# Patient Record
Sex: Female | Born: 1975 | Race: Asian | Hispanic: No | Marital: Married | Smoking: Never smoker
Health system: Southern US, Community
[De-identification: ages and names within clinical notes are randomized; demographics above are authoritative.]

## PROBLEM LIST (undated history)

## (undated) DIAGNOSIS — Z973 Presence of spectacles and contact lenses: Secondary | ICD-10-CM

## (undated) HISTORY — PX: NOSE SURGERY: SHX723

---

## 2012-01-09 ENCOUNTER — Encounter (HOSPITAL_COMMUNITY): Payer: Self-pay | Admitting: *Deleted

## 2012-01-09 ENCOUNTER — Emergency Department (HOSPITAL_COMMUNITY)
Admission: EM | Admit: 2012-01-09 | Discharge: 2012-01-10 | Disposition: A | Discharge status: Home / Self Care | Payer: BC Managed Care – PPO | Attending: Emergency Medicine | Admitting: Emergency Medicine

## 2012-01-09 ENCOUNTER — Emergency Department (HOSPITAL_COMMUNITY): Payer: BC Managed Care – PPO

## 2012-01-09 DIAGNOSIS — IMO0002 Reserved for concepts with insufficient information to code with codable children: Secondary | ICD-10-CM | POA: Insufficient documentation

## 2012-01-09 DIAGNOSIS — S022XXB Fracture of nasal bones, initial encounter for open fracture: Secondary | ICD-10-CM | POA: Insufficient documentation

## 2012-01-09 DIAGNOSIS — Z23 Encounter for immunization: Secondary | ICD-10-CM | POA: Insufficient documentation

## 2012-01-09 MED ORDER — CLINDAMYCIN HCL 150 MG PO CAPS: 300.0000 mg | ORAL_CAPSULE | Freq: Three times a day (TID) | ORAL | Status: DC

## 2012-01-09 MED ORDER — TETANUS-DIPHTH-ACELL PERTUSSIS 5-2.5-18.5 LF-MCG/0.5 IM SUSP
0.5000 mL | Freq: Once | INTRAMUSCULAR | Status: AC
  Administered 2012-01-09: 0.5 mL via INTRAMUSCULAR
  Filled 2012-01-09: qty 0.5

## 2012-01-09 MED ORDER — HYDROCODONE-ACETAMINOPHEN 5-325 MG PO TABS: 1.0000 | ORAL_TABLET | ORAL | Status: DC | PRN

## 2012-01-09 MED ORDER — HYDROCODONE-ACETAMINOPHEN 5-325 MG PO TABS
1.0000 | ORAL_TABLET | Freq: Once | ORAL | Status: AC
  Administered 2012-01-09: 1 via ORAL
  Filled 2012-01-09: qty 1

## 2012-01-09 NOTE — ED Notes (Signed)
Patient transported to CT 
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
## 2012-01-09 NOTE — ED Notes (Signed)
Nose ring removed by husband using sterile pin cutters.
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
## 2012-01-09 NOTE — ED Notes (Signed)
Patient applying ice to area.

## 2012-01-22 ENCOUNTER — Ambulatory Visit (INDEPENDENT_AMBULATORY_CARE_PROVIDER_SITE_OTHER): Payer: BC Managed Care – PPO | Admitting: Internal Medicine

## 2012-01-22 VITALS — BP 92/60 | HR 91 | Temp 98.3°F | Resp 16 | Ht 60.0 in | Wt 96.6 lb

## 2012-01-22 DIAGNOSIS — J029 Acute pharyngitis, unspecified: Secondary | ICD-10-CM

## 2012-01-22 DIAGNOSIS — J020 Streptococcal pharyngitis: Secondary | ICD-10-CM

## 2012-01-22 MED ORDER — AMOXICILLIN 875 MG PO TABS: 875.0000 mg | ORAL_TABLET | Freq: Two times a day (BID) | ORAL | Status: DC

## 2012-01-22 NOTE — Progress Notes (Signed)
  Subjective:    Patient ID: Laura Wells, female    DOB: 1975/12/27, 36 y.o.   MRN: [id number]  HPI Felt sluggish yesterday then awoke with a sore throat today No definite fever No cough or rhinorrhea   Review of Systems     Objective:   Physical Exam Vital signs stable TMs clear Nares clear Throat with 3+ tonsils covered with white exudate Shotty a.c. Nodes    Results for orders placed in visit on 01/22/12  POCT RAPID STREP A (OFFICE)      Component Value Range   Rapid Strep A Screen Positive (*) Negative          Assessment & Plan:  Problem #1 strep pharyngitis Plan Amoxicillin 875 twice a day for 10 days

## 2012-08-18 ENCOUNTER — Ambulatory Visit: Payer: BC Managed Care – PPO

## 2014-12-03 ENCOUNTER — Other Ambulatory Visit: Payer: Self-pay | Admitting: Internal Medicine

## 2014-12-03 DIAGNOSIS — E049 Nontoxic goiter, unspecified: Secondary | ICD-10-CM

## 2014-12-05 ENCOUNTER — Other Ambulatory Visit: Payer: Self-pay | Admitting: Internal Medicine

## 2014-12-05 ENCOUNTER — Ambulatory Visit
Admission: RE | Admit: 2014-12-05 | Discharge: 2014-12-05 | Disposition: A | Discharge status: Home / Self Care | Payer: BLUE CROSS/BLUE SHIELD | Source: Ambulatory Visit | Attending: Internal Medicine | Admitting: Internal Medicine

## 2014-12-05 DIAGNOSIS — E049 Nontoxic goiter, unspecified: Secondary | ICD-10-CM

## 2015-06-25 DIAGNOSIS — J209 Acute bronchitis, unspecified: Secondary | ICD-10-CM | POA: Diagnosis not present

## 2015-06-25 DIAGNOSIS — R05 Cough: Secondary | ICD-10-CM | POA: Diagnosis not present

## 2015-06-25 DIAGNOSIS — R350 Frequency of micturition: Secondary | ICD-10-CM | POA: Diagnosis not present

## 2015-12-19 DIAGNOSIS — Z6821 Body mass index (BMI) 21.0-21.9, adult: Secondary | ICD-10-CM | POA: Diagnosis not present

## 2015-12-19 DIAGNOSIS — Z1231 Encounter for screening mammogram for malignant neoplasm of breast: Secondary | ICD-10-CM | POA: Diagnosis not present

## 2015-12-19 DIAGNOSIS — Z01419 Encounter for gynecological examination (general) (routine) without abnormal findings: Secondary | ICD-10-CM | POA: Diagnosis not present

## 2016-01-02 DIAGNOSIS — N760 Acute vaginitis: Secondary | ICD-10-CM | POA: Diagnosis not present

## 2016-01-06 DIAGNOSIS — Z0001 Encounter for general adult medical examination with abnormal findings: Secondary | ICD-10-CM | POA: Diagnosis not present

## 2016-01-06 DIAGNOSIS — E785 Hyperlipidemia, unspecified: Secondary | ICD-10-CM | POA: Diagnosis not present

## 2016-01-06 DIAGNOSIS — E559 Vitamin D deficiency, unspecified: Secondary | ICD-10-CM | POA: Diagnosis not present

## 2016-01-06 DIAGNOSIS — Z23 Encounter for immunization: Secondary | ICD-10-CM | POA: Diagnosis not present

## 2016-01-07 DIAGNOSIS — M62838 Other muscle spasm: Secondary | ICD-10-CM | POA: Diagnosis not present

## 2016-01-15 DIAGNOSIS — R87610 Atypical squamous cells of undetermined significance on cytologic smear of cervix (ASC-US): Secondary | ICD-10-CM | POA: Diagnosis not present

## 2016-01-15 DIAGNOSIS — N926 Irregular menstruation, unspecified: Secondary | ICD-10-CM | POA: Diagnosis not present

## 2016-01-22 DIAGNOSIS — N921 Excessive and frequent menstruation with irregular cycle: Secondary | ICD-10-CM | POA: Diagnosis not present

## 2016-02-26 DIAGNOSIS — J209 Acute bronchitis, unspecified: Secondary | ICD-10-CM | POA: Diagnosis not present

## 2016-03-08 ENCOUNTER — Other Ambulatory Visit: Payer: Self-pay | Admitting: Internal Medicine

## 2016-03-08 ENCOUNTER — Ambulatory Visit
Admission: RE | Admit: 2016-03-08 | Discharge: 2016-03-08 | Disposition: A | Discharge status: Home / Self Care | Payer: BLUE CROSS/BLUE SHIELD | Source: Ambulatory Visit | Attending: Internal Medicine | Admitting: Internal Medicine

## 2016-03-08 DIAGNOSIS — R059 Cough, unspecified: Secondary | ICD-10-CM

## 2016-03-08 DIAGNOSIS — R509 Fever, unspecified: Secondary | ICD-10-CM | POA: Insufficient documentation

## 2016-03-08 DIAGNOSIS — R05 Cough: Secondary | ICD-10-CM

## 2016-03-08 DIAGNOSIS — J45991 Cough variant asthma: Secondary | ICD-10-CM | POA: Diagnosis not present

## 2016-03-08 DIAGNOSIS — R062 Wheezing: Secondary | ICD-10-CM | POA: Diagnosis not present

## 2016-03-09 DIAGNOSIS — J209 Acute bronchitis, unspecified: Secondary | ICD-10-CM | POA: Diagnosis not present

## 2016-12-27 IMAGING — CR DG CHEST 2V
1 series · 2 of 2 positions shown · non-contrast
Comparison: None in PACs

CLINICAL DATA: Cough for the past 3 weeks with left-sided chest
discomfort for the past 2 weeks. No cardiac history, nonsmoker.

EXAM:
CHEST  2 VIEW

[Series 1: dg chest 2 view · 0.14mm/px · 2 of 2 slices shown]
[im 1/2]
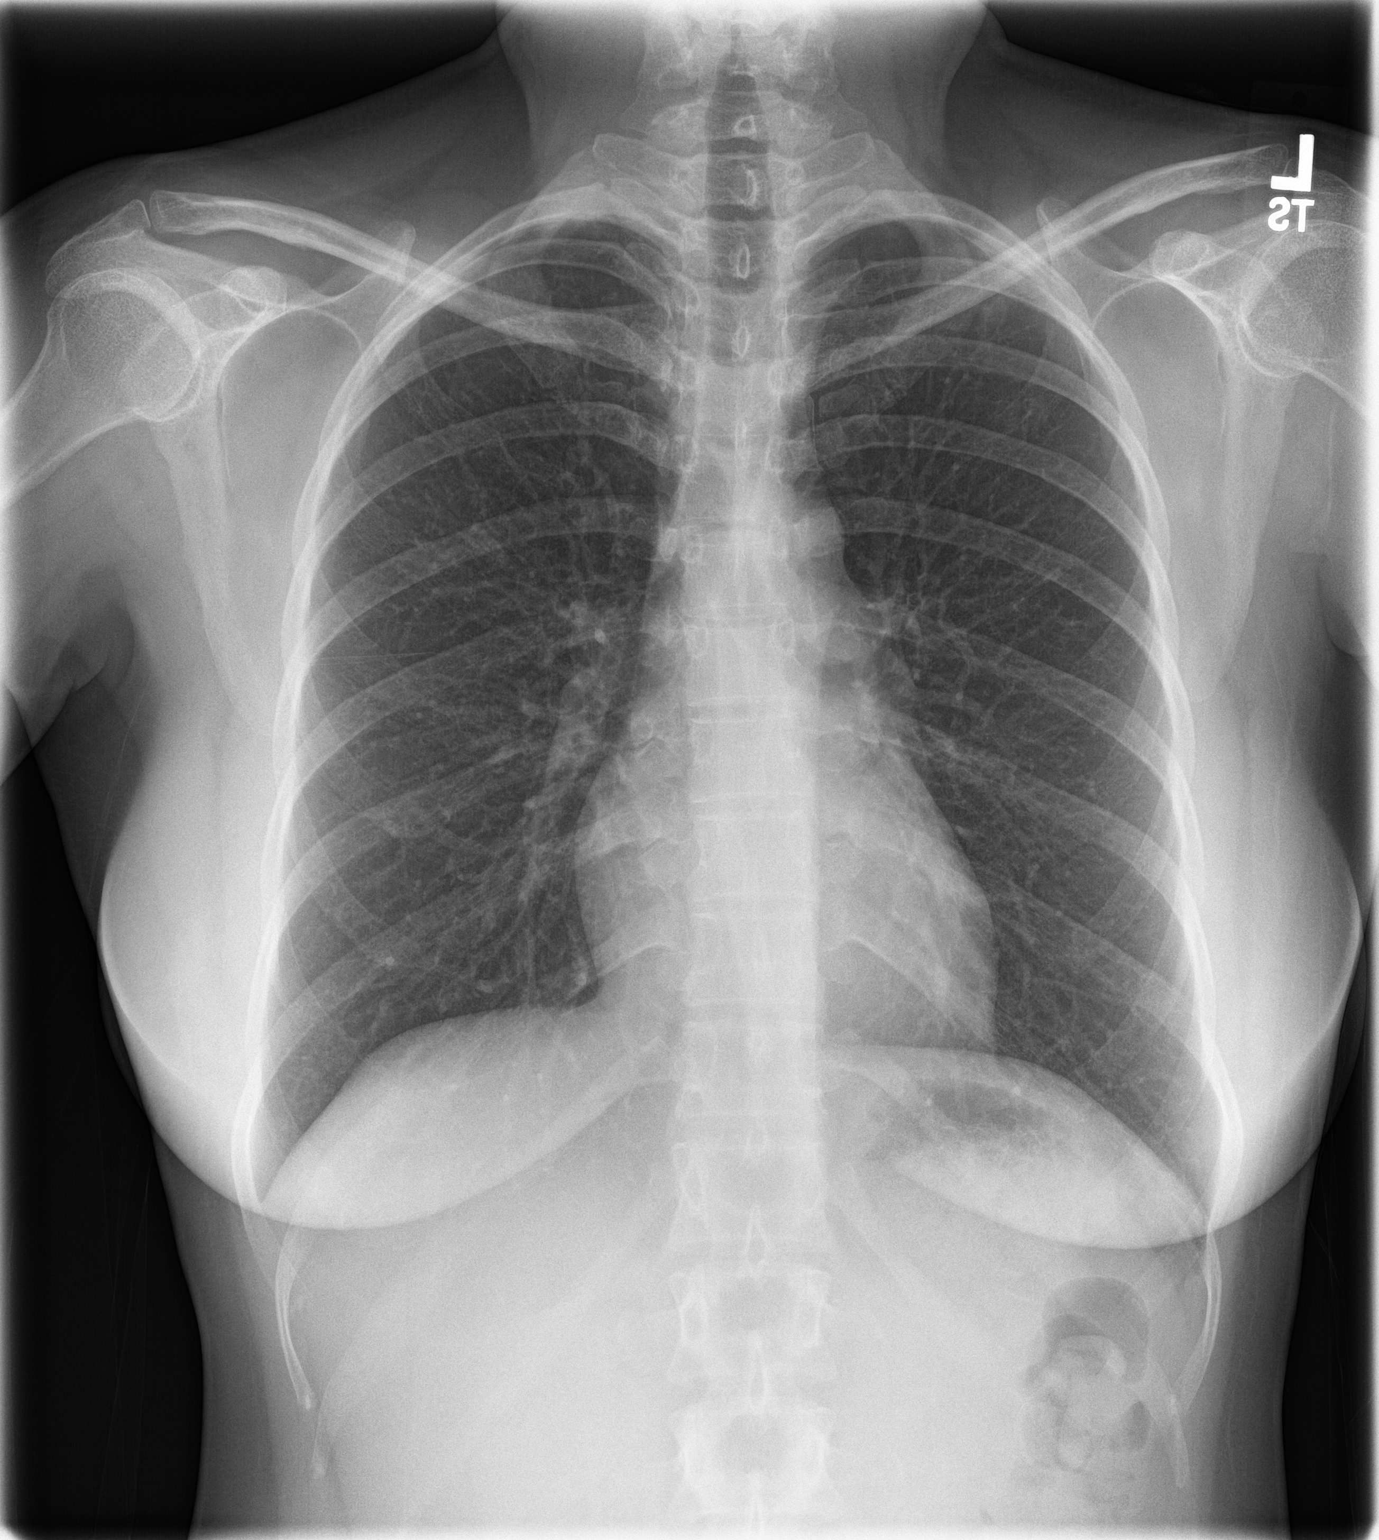
[im 2/2]
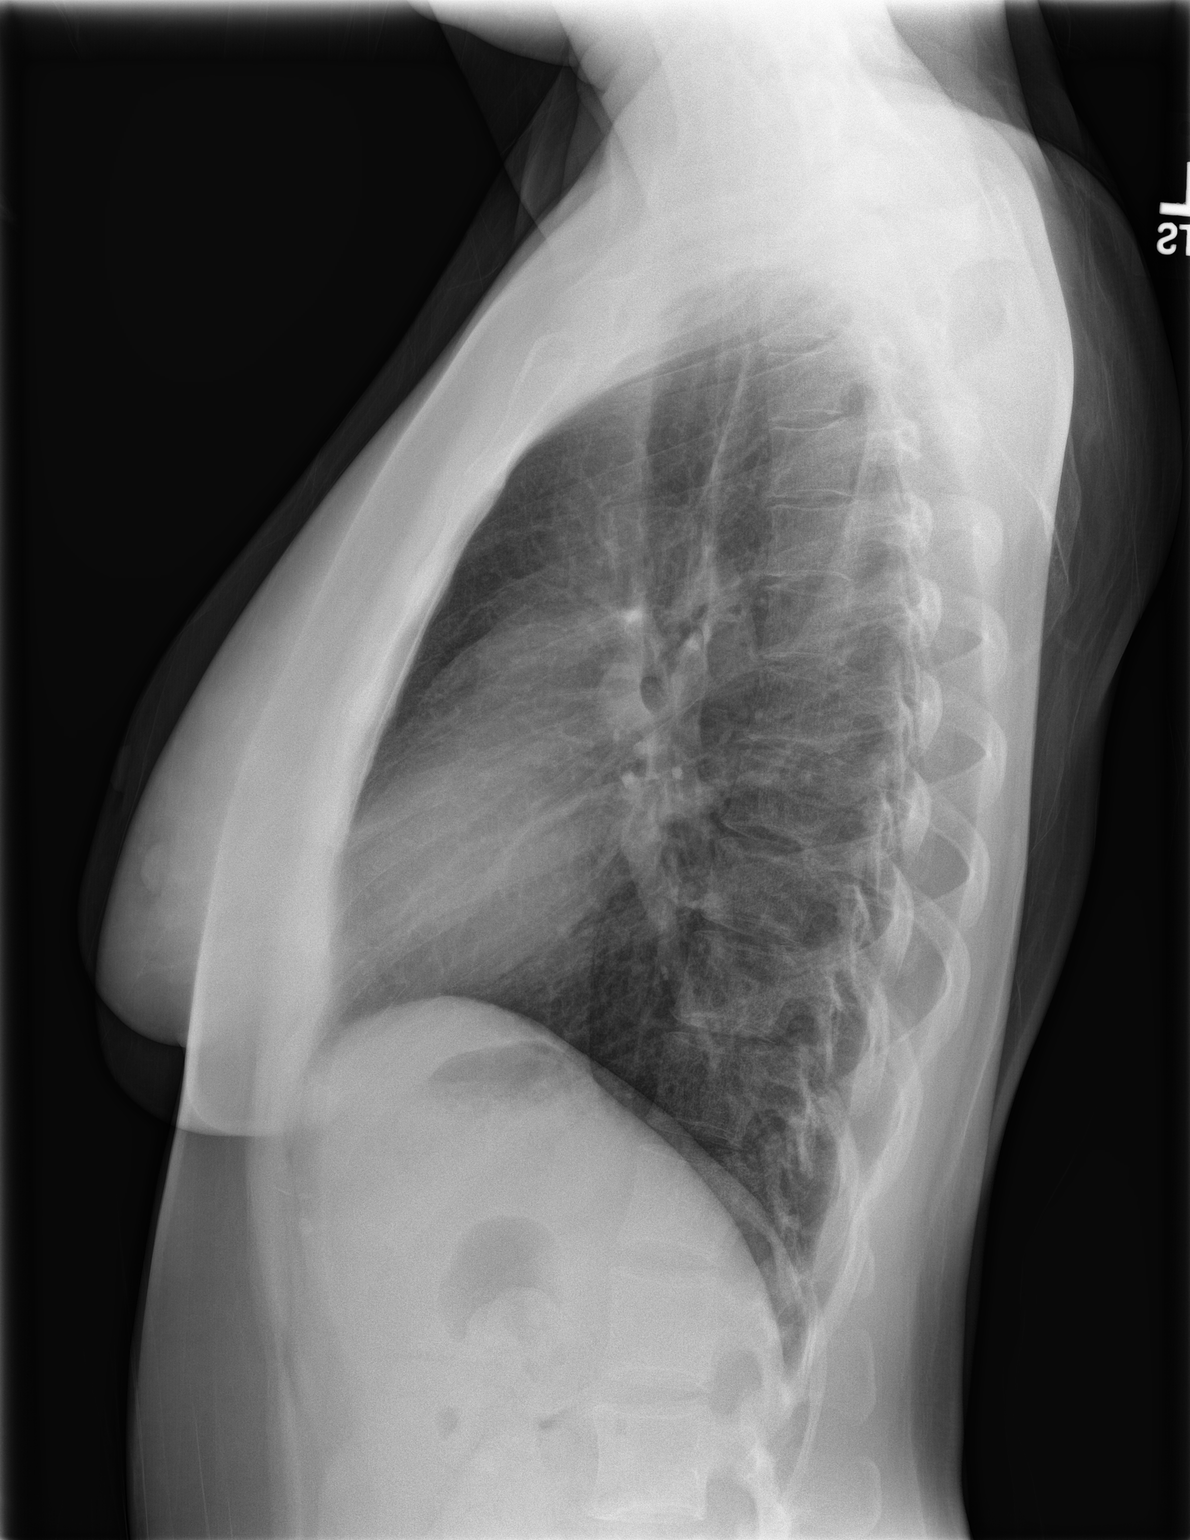

[2 of 2 positions shown; findings below may reference images not displayed]

FINDINGS: The lungs are borderline hyperinflated but clear. The heart and
pulmonary vascularity are normal. The mediastinum is normal in
width. There is no pleural effusion. The bony thorax exhibits no
acute abnormality.
IMPRESSION: There is no active cardiopulmonary disease.

## 2017-03-09 DIAGNOSIS — Z0000 Encounter for general adult medical examination without abnormal findings: Secondary | ICD-10-CM | POA: Diagnosis not present

## 2017-03-09 DIAGNOSIS — Z23 Encounter for immunization: Secondary | ICD-10-CM | POA: Diagnosis not present

## 2017-03-10 DIAGNOSIS — E049 Nontoxic goiter, unspecified: Secondary | ICD-10-CM | POA: Diagnosis not present

## 2017-03-10 DIAGNOSIS — Z13220 Encounter for screening for lipoid disorders: Secondary | ICD-10-CM | POA: Diagnosis not present

## 2017-03-10 DIAGNOSIS — R5383 Other fatigue: Secondary | ICD-10-CM | POA: Diagnosis not present

## 2017-03-10 DIAGNOSIS — E559 Vitamin D deficiency, unspecified: Secondary | ICD-10-CM | POA: Diagnosis not present

## 2017-03-31 DIAGNOSIS — J0100 Acute maxillary sinusitis, unspecified: Secondary | ICD-10-CM | POA: Diagnosis not present

## 2017-04-19 DIAGNOSIS — R0602 Shortness of breath: Secondary | ICD-10-CM | POA: Diagnosis not present

## 2017-04-19 DIAGNOSIS — R05 Cough: Secondary | ICD-10-CM | POA: Diagnosis not present

## 2017-04-19 DIAGNOSIS — J209 Acute bronchitis, unspecified: Secondary | ICD-10-CM | POA: Diagnosis not present

## 2017-04-21 ENCOUNTER — Encounter: Payer: Self-pay | Admitting: Obstetrics & Gynecology

## 2017-04-21 ENCOUNTER — Ambulatory Visit: Payer: BLUE CROSS/BLUE SHIELD | Admitting: Obstetrics & Gynecology

## 2017-04-21 VITALS — BP 102/70

## 2017-04-21 DIAGNOSIS — N945 Secondary dysmenorrhea: Secondary | ICD-10-CM | POA: Diagnosis not present

## 2017-04-21 DIAGNOSIS — R198 Other specified symptoms and signs involving the digestive system and abdomen: Secondary | ICD-10-CM | POA: Diagnosis not present

## 2017-04-21 NOTE — Patient Instructions (Signed)
1. Secondary dysmenorrhea Normal gynecologic exam.  Secondary dysmenorrhea.  Will try regular ibuprofen 600 mg every 8 hours during her periods.  Will write a calendar of her pain and the quality of the pain.  Will follow up for annual gynecologic exam in March and decide if further management steps are needed.  2. Pain with bowel movements No evidence of rectal or anal pathology.  Pain more likely due to uterine contractions during her menses.  Will try ibuprofen as above and follow-up in March.  Laura Wells, good seeing you today!  Dysmenorrhea Menstrual cramps (dysmenorrhea) are caused by the muscles of the uterus tightening (contracting) during a menstrual period. For some women, this discomfort is merely bothersome. For others, dysmenorrhea can be severe enough to interfere with everyday activities for a few days each month. Primary dysmenorrhea is menstrual cramps that last a couple of days when you start having menstrual periods or soon after. This often begins after a teenager starts having her period. As a woman gets older or has a baby, the cramps will usually lessen or disappear. Secondary dysmenorrhea begins later in life, lasts longer, and the pain may be stronger than primary dysmenorrhea. The pain may start before the period and last a few days after the period. What are the causes? Dysmenorrhea is usually caused by an underlying problem, such as:  The tissue lining the uterus grows outside of the uterus in other areas of the body (endometriosis).  The endometrial tissue, which normally lines the uterus, is found in or grows into the muscular walls of the uterus (adenomyosis).  The pelvic blood vessels are engorged with blood just before the menstrual period (pelvic congestive syndrome).  Overgrowth of cells (polyps) in the lining of the uterus or cervix.  Falling down of the uterus (prolapse) because of loose or stretched ligaments.  Depression.  Bladder problems, infection, or  inflammation.  Problems with the intestine, a tumor, or irritable bowel syndrome.  Cancer of the female organs or bladder.  A severely tipped uterus.  A very tight opening or closed cervix.  Noncancerous tumors of the uterus (fibroids).  Pelvic inflammatory disease (PID).  Pelvic scarring (adhesions) from a previous surgery.  Ovarian cyst.  An intrauterine device (IUD) used for birth control.  What increases the risk? You may be at greater risk of dysmenorrhea if:  You are younger than age 22.  You started puberty early.  You have irregular or heavy bleeding.  You have never given birth.  You have a family history of this problem.  You are a smoker.  What are the signs or symptoms?  Cramping or throbbing pain in your lower abdomen.  Headaches.  Lower back pain.  Nausea or vomiting.  Diarrhea.  Sweating or dizziness.  Loose stools. How is this diagnosed? A diagnosis is based on your history, symptoms, physical exam, diagnostic tests, or procedures. Diagnostic tests or procedures may include:  Blood tests.  Ultrasonography.  An examination of the lining of the uterus (dilation and curettage, D&C).  An examination inside your abdomen or pelvis with a scope (laparoscopy).  X-rays.  CT scan.  MRI.  An examination inside the bladder with a scope (cystoscopy).  An examination inside the intestine or stomach with a scope (colonoscopy, gastroscopy).  How is this treated? Treatment depends on the cause of the dysmenorrhea. Treatment may include:  Pain medicine prescribed by your health care provider.  Birth control pills or an IUD with progesterone hormone in it.  Hormone replacement therapy.  Nonsteroidal anti-inflammatory drugs (NSAIDs). These may help stop the production of prostaglandins.  Surgery to remove adhesions, endometriosis, ovarian cyst, or fibroids.  Removal of the uterus (hysterectomy).  Progesterone shots to stop the  menstrual period.  Cutting the nerves on the sacrum that go to the female organs (presacral neurectomy).  Electric current to the sacral nerves (sacral nerve stimulation).  Antidepressant medicine.  Psychiatric therapy, counseling, or group therapy.  Exercise and physical therapy.  Meditation and yoga therapy.  Acupuncture.  Follow these instructions at home:  Only take over-the-counter or prescription medicines as directed by your health care provider.  Place a heating pad or hot water bottle on your lower back or abdomen. Do not sleep with the heating pad.  Use aerobic exercises, walking, swimming, biking, and other exercises to help lessen the cramping.  Massage to the lower back or abdomen may help.  Stop smoking.  Avoid alcohol and caffeine. Contact a health care provider if:  Your pain does not get better with medicine.  You have pain with sexual intercourse.  Your pain increases and is not controlled with medicines.  You have abnormal vaginal bleeding with your period.  You develop nausea or vomiting with your period that is not controlled with medicine. Get help right away if: You pass out. This information is not intended to replace advice given to you by your health care provider. Make sure you discuss any questions you have with your health care provider. Document Released: 03/08/2005 Document Revised: 08/14/2015 Document Reviewed: 08/24/2012 Elsevier Interactive Patient Education  2017 [employer]

## 2017-04-21 NOTE — Progress Notes (Signed)
    Laura Wells February 18, 1976 [id number]        42 y.o.  G3P3L3 Married.  Husband Vasectomized.  RP: Menstrual pain worse around BMs x 1 year  HPI: Menstrual periods every months lasting 4 days with light flow.  Patient complains of pelvic cramping and pain around the anal area happening only during her periods and at the time when she sits down for a bowel movement.  She reports normal bowel movements with normal texture both during her periods and when she is not bleeding.  No blood around the stools and no rectal bleeding.  No pain with intercourse.  Patient has both vaginal and anal sex.  Normal vaginal secretions.  Uses ibuprofen as needed but not on a regular basis during her periods.  Micturitions normal.  No fever.   OB History  Gravida Para Term Preterm AB Living  3 3       3   SAB TAB Ectopic Multiple Live Births               # Outcome Date GA Lbr Len/2nd Weight Sex Delivery Anes PTL Lv  3 Para           2 Para           1 Para               Past medical history,surgical history, problem list, medications, allergies, family history and social history were all reviewed and documented in the EPIC chart.   Directed ROS with pertinent positives and negatives documented in the history of present illness/assessment and plan.  Exam:  Vitals:   04/21/17 1146  BP: 102/70   General appearance:  Normal  Abdomen: Soft, not distended, nontender  Gynecologic exam: Vulva normal.  Bimanual exam:  Uterus AV, normal volume, mobile, nontender.  No adnexal mass, nontender.  Rectal exam: Normal, no lesion felt, nontender, no blood on glove.   Assessment/Plan:  42 y.o. G3P3   1. Secondary dysmenorrhea Normal gynecologic exam.  Secondary dysmenorrhea.  Will try regular ibuprofen 600 mg every 8 hours during her periods.  Will write a calendar of her pain and the quality of the pain.  Will follow up for annual gynecologic exam in March and decide if further management steps are  needed.  2. Pain with bowel movements No evidence of rectal or anal pathology.  Pain more likely due to uterine contractions during her menses.  Will try ibuprofen as above and follow-up in March.  Counseling on above issues >50% x 25 minutes.  Genia DelMarie-Lyne Darion Juhasz MD, 12:05 PM 04/21/2017

## 2017-05-20 ENCOUNTER — Ambulatory Visit: Payer: BLUE CROSS/BLUE SHIELD | Admitting: Obstetrics & Gynecology

## 2017-05-20 ENCOUNTER — Encounter: Payer: Self-pay | Admitting: Obstetrics & Gynecology

## 2017-05-20 VITALS — BP 106/70 | Ht 60.0 in | Wt 114.0 lb

## 2017-05-20 DIAGNOSIS — Z01419 Encounter for gynecological examination (general) (routine) without abnormal findings: Secondary | ICD-10-CM | POA: Diagnosis not present

## 2017-05-20 DIAGNOSIS — N945 Secondary dysmenorrhea: Secondary | ICD-10-CM | POA: Diagnosis not present

## 2017-05-20 DIAGNOSIS — Z9189 Other specified personal risk factors, not elsewhere classified: Secondary | ICD-10-CM | POA: Diagnosis not present

## 2017-05-20 DIAGNOSIS — R87610 Atypical squamous cells of undetermined significance on cytologic smear of cervix (ASC-US): Secondary | ICD-10-CM | POA: Diagnosis not present

## 2017-05-20 NOTE — Progress Notes (Signed)
Laura Wells 1975/12/22 [id number]   History:    42 y.o. G3P3L3  Married.  Vasectomy  RP:  Established patient presenting for annual gyn exam   HPI: Menses normal with light flow x 4 days every 3 weeks.  Presented with Dysmenorrhea 04/21/2017, but LMP with no pelvic cramp or pain on 04/29/2017.  No pain with IC.  Urine/BMs wnl.  Breasts wnl.  BMI 22.26.  Health labs with Fam MD.  Past medical history,surgical history, family history and social history were all reviewed and documented in the EPIC chart.  Gynecologic History Patient's last menstrual period was 04/29/2017. Contraception: vasectomy Last Pap: 12/2015. Results were: Negative/HPV HR neg Last mammogram: 11/2015. Results were: normal Bone Density: Never Colonoscopy: Never  Obstetric History OB History  Gravida Para Term Preterm AB Living  3 3       3   SAB TAB Ectopic Multiple Live Births               # Outcome Date GA Lbr Len/2nd Weight Sex Delivery Anes PTL Lv  3 Para           2 Para           1 Para                ROS: A ROS was performed and pertinent positives and negatives are included in the history.  GENERAL: No fevers or chills. HEENT: No change in vision, no earache, sore throat or sinus congestion. NECK: No pain or stiffness. CARDIOVASCULAR: No chest pain or pressure. No palpitations. PULMONARY: No shortness of breath, cough or wheeze. GASTROINTESTINAL: No abdominal pain, nausea, vomiting or diarrhea, melena or bright red blood per rectum. GENITOURINARY: No urinary frequency, urgency, hesitancy or dysuria. MUSCULOSKELETAL: No joint or muscle pain, no back pain, no recent trauma. DERMATOLOGIC: No rash, no itching, no lesions. ENDOCRINE: No polyuria, polydipsia, no heat or cold intolerance. No recent change in weight. HEMATOLOGICAL: No anemia or easy bruising or bleeding. NEUROLOGIC: No headache, seizures, numbness, tingling or weakness. PSYCHIATRIC: No depression, no loss of interest in normal activity or  change in sleep pattern.     Exam:   BP 106/70   Ht 5' (1.524 m)   Wt 114 lb (51.7 kg)   LMP 04/29/2017 Comment: vasectomy  BMI 22.26 kg/m   Body mass index is 22.26 kg/m.  General appearance : Well developed well nourished female. No acute distress HEENT: Eyes: no retinal hemorrhage or exudates,  Neck supple, trachea midline, no carotid bruits, no thyroidmegaly Lungs: Clear to auscultation, no rhonchi or wheezes, or rib retractions  Heart: Regular rate and rhythm, no murmurs or gallops Breast:Examined in sitting and supine position were symmetrical in appearance, no palpable masses or tenderness,  no skin retraction, no nipple inversion, no nipple discharge, no skin discoloration, no axillary or supraclavicular lymphadenopathy Abdomen: no palpable masses or tenderness, no rebound or guarding Extremities: no edema or skin discoloration or tenderness  Pelvic: Vulva: Normal             Vagina: No gross lesions or discharge  Cervix: No gross lesions or discharge.  Pap reflex done.  Uterus  AV, normal size, shape and consistency, non-tender and mobile  Adnexa  Without masses or tenderness  Anus: Normal   Assessment/Plan:  42 y.o. female for annual exam   1. Encounter for routine gynecological examination with Papanicolaou smear of cervix Normal gynecologic exam.  Pap reflex done today.  Breast exam normal.  Will schedule  screening mammogram at the breast center.  Health labs with family physician.  Recommend regular physical activity.  2. Relies on partner vasectomy for contraception  3. Secondary dysmenorrhea Resolved with LMP 04/29/2017.  [name], 12:23 PM 05/20/2017

## 2017-05-20 NOTE — Addendum Note (Signed)
Addended by: Berna SpareASTILLO, Gery Sabedra A on: 05/20/2017 01:07 PM   Modules accepted: Orders

## 2017-05-20 NOTE — Patient Instructions (Signed)
1. Encounter for routine gynecological examination with Papanicolaou smear of cervix Normal gynecologic exam.  Pap reflex done today.  Breast exam normal.  Will schedule screening mammogram at the breast center.  Health labs with family physician.  Recommend regular physical activity.  2. Relies on partner vasectomy for contraception  3. Secondary dysmenorrhea Resolved with LMP 04/29/2017.  [place], it was a pleasure seeing you today!  I will inform you of your results as soon as they are available.  Health Maintenance, Female Adopting a healthy lifestyle and getting preventive care can go a long way to promote health and wellness. Talk with your health care provider about what schedule of regular examinations is right for you. This is a good chance for you to check in with your provider about disease prevention and staying healthy. In between checkups, there are plenty of things you can do on your own. Experts have done a lot of research about which lifestyle changes and preventive measures are most likely to keep you healthy. Ask your health care provider for more information. Weight and diet Eat a healthy diet  Be sure to include plenty of vegetables, fruits, low-fat dairy products, and lean protein.  Do not eat a lot of foods high in solid fats, added sugars, or salt.  Get regular exercise. This is one of the most important things you can do for your health. ? Most adults should exercise for at least 150 minutes each week. The exercise should increase your heart rate and make you sweat (moderate-intensity exercise). ? Most adults should also do strengthening exercises at least twice a week. This is in addition to the moderate-intensity exercise.  Maintain a healthy weight  Body mass index (BMI) is a measurement that can be used to identify possible weight problems. It estimates body fat based on height and weight. Your health care provider can help determine your BMI and help you achieve or  maintain a healthy weight.  For females 58 years of age and older: ? A BMI below 18.5 is considered underweight. ? A BMI of 18.5 to 24.9 is normal. ? A BMI of 25 to 29.9 is considered overweight. ? A BMI of 30 and above is considered obese.  Watch levels of cholesterol and blood lipids  You should start having your blood tested for lipids and cholesterol at 44 years of age, then have this test every 5 years.  You may need to have your cholesterol levels checked more often if: ? Your lipid or cholesterol levels are high. ? You are older than 44 years of age. ? You are at high risk for heart disease.  Cancer screening Lung Cancer  Lung cancer screening is recommended for adults 81-12 years old who are at high risk for lung cancer because of a history of smoking.  A yearly low-dose CT scan of the lungs is recommended for people who: ? Currently smoke. ? Have quit within the past 15 years. ? Have at least a 30-pack-year history of smoking. A pack year is smoking an average of one pack of cigarettes a day for 1 year.  Yearly screening should continue until it has been 15 years since you quit.  Yearly screening should stop if you develop a health problem that would prevent you from having lung cancer treatment.  Breast Cancer  Practice breast self-awareness. This means understanding how your breasts normally appear and feel.  It also means doing regular breast self-exams. Let your health care provider know about any changes,  no matter how small.  If you are in your 20s or 30s, you should have a clinical breast exam (CBE) by a health care provider every 1-3 years as part of a regular health exam.  If you are 40 or older, have a CBE every year. Also consider having a breast X-ray (mammogram) every year.  If you have a family history of breast cancer, talk to your health care provider about genetic screening.  If you are at high risk for breast cancer, talk to your health care  provider about having an MRI and a mammogram every year.  Breast cancer gene (BRCA) assessment is recommended for women who have family members with BRCA-related cancers. BRCA-related cancers include: ? Breast. ? Ovarian. ? Tubal. ? Peritoneal cancers.  Results of the assessment will determine the need for genetic counseling and BRCA1 and BRCA2 testing.  Cervical Cancer Your health care provider may recommend that you be screened regularly for cancer of the pelvic organs (ovaries, uterus, and vagina). This screening involves a pelvic examination, including checking for microscopic changes to the surface of your cervix (Pap test). You may be encouraged to have this screening done every 3 years, beginning at age 21.  For women ages 30-65, health care providers may recommend pelvic exams and Pap testing every 3 years, or they may recommend the Pap and pelvic exam, combined with testing for human papilloma virus (HPV), every 5 years. Some types of HPV increase your risk of cervical cancer. Testing for HPV may also be done on women of any age with unclear Pap test results.  Other health care providers may not recommend any screening for nonpregnant women who are considered low risk for pelvic cancer and who do not have symptoms. Ask your health care provider if a screening pelvic exam is right for you.  If you have had past treatment for cervical cancer or a condition that could lead to cancer, you need Pap tests and screening for cancer for at least 20 years after your treatment. If Pap tests have been discontinued, your risk factors (such as having a new sexual partner) need to be reassessed to determine if screening should resume. Some women have medical problems that increase the chance of getting cervical cancer. In these cases, your health care provider may recommend more frequent screening and Pap tests.  Colorectal Cancer  This type of cancer can be detected and often prevented.  Routine  colorectal cancer screening usually begins at 44 years of age and continues through 44 years of age.  Your health care provider may recommend screening at an earlier age if you have risk factors for colon cancer.  Your health care provider may also recommend using home test kits to check for hidden blood in the stool.  A small camera at the end of a tube can be used to examine your colon directly (sigmoidoscopy or colonoscopy). This is done to check for the earliest forms of colorectal cancer.  Routine screening usually begins at age 50.  Direct examination of the colon should be repeated every 5-10 years through 44 years of age. However, you may need to be screened more often if early forms of precancerous polyps or small growths are found.  Skin Cancer  Check your skin from head to toe regularly.  Tell your health care provider about any new moles or changes in moles, especially if there is a change in a mole's shape or color.  Also tell your health care provider if you   have a mole that is larger than the size of a pencil eraser.  Always use sunscreen. Apply sunscreen liberally and repeatedly throughout the day.  Protect yourself by wearing long sleeves, pants, a wide-brimmed hat, and sunglasses whenever you are outside.  Heart disease, diabetes, and high blood pressure  High blood pressure causes heart disease and increases the risk of stroke. High blood pressure is more likely to develop in: ? People who have blood pressure in the high end of the normal range (130-139/85-89 mm Hg). ? People who are overweight or obese. ? People who are African American.  If you are 24-25 years of age, have your blood pressure checked every 3-5 years. If you are 2 years of age or older, have your blood pressure checked every year. You should have your blood pressure measured twice-once when you are at a hospital or clinic, and once when you are not at a hospital or clinic. Record the average of the  two measurements. To check your blood pressure when you are not at a hospital or clinic, you can use: ? An automated blood pressure machine at a pharmacy. ? A home blood pressure monitor.  If you are between 42 years and 59 years old, ask your health care provider if you should take aspirin to prevent strokes.  Have regular diabetes screenings. This involves taking a blood sample to check your fasting blood sugar level. ? If you are at a normal weight and have a low risk for diabetes, have this test once every three years after 44 years of age. ? If you are overweight and have a high risk for diabetes, consider being tested at a younger age or more often. Preventing infection Hepatitis B  If you have a higher risk for hepatitis B, you should be screened for this virus. You are considered at high risk for hepatitis B if: ? You were born in a country where hepatitis B is common. Ask your health care provider which countries are considered high risk. ? Your parents were born in a high-risk country, and you have not been immunized against hepatitis B (hepatitis B vaccine). ? You have HIV or AIDS. ? You use needles to inject street drugs. ? You live with someone who has hepatitis B. ? You have had sex with someone who has hepatitis B. ? You get hemodialysis treatment. ? You take certain medicines for conditions, including cancer, organ transplantation, and autoimmune conditions.  Hepatitis C  Blood testing is recommended for: ? Everyone born from [birth year] through 1967. ? Anyone with known risk factors for hepatitis C.  Sexually transmitted infections (STIs)  You should be screened for sexually transmitted infections (STIs) including gonorrhea and chlamydia if: ? You are sexually active and are younger than 44 years of age. ? You are older than 44 years of age and your health care provider tells you that you are at risk for this type of infection. ? Your sexual activity has changed since you  were last screened and you are at an increased risk for chlamydia or gonorrhea. Ask your health care provider if you are at risk.  If you do not have HIV, but are at risk, it may be recommended that you take a prescription medicine daily to prevent HIV infection. This is called pre-exposure prophylaxis (PrEP). You are considered at risk if: ? You are sexually active and do not regularly use condoms or know the HIV status of your partner(s). ? You take drugs by injection. ?  You are sexually active with a partner who has HIV.  Talk with your health care provider about whether you are at high risk of being infected with HIV. If you choose to begin PrEP, you should first be tested for HIV. You should then be tested every 3 months for as long as you are taking PrEP. Pregnancy  If you are premenopausal and you may become pregnant, ask your health care provider about preconception counseling.  If you may become pregnant, take 400 to 800 micrograms (mcg) of folic acid every day.  If you want to prevent pregnancy, talk to your health care provider about birth control (contraception). Osteoporosis and menopause  Osteoporosis is a disease in which the bones lose minerals and strength with aging. This can result in serious bone fractures. Your risk for osteoporosis can be identified using a bone density scan.  If you are 65 years of age or older, or if you are at risk for osteoporosis and fractures, ask your health care provider if you should be screened.  Ask your health care provider whether you should take a calcium or vitamin D supplement to lower your risk for osteoporosis.  Menopause may have certain physical symptoms and risks.  Hormone replacement therapy may reduce some of these symptoms and risks. Talk to your health care provider about whether hormone replacement therapy is right for you. Follow these instructions at home:  Schedule regular health, dental, and eye exams.  Stay current  with your immunizations.  Do not use any tobacco products including cigarettes, chewing tobacco, or electronic cigarettes.  If you are pregnant, do not drink alcohol.  If you are breastfeeding, limit how much and how often you drink alcohol.  Limit alcohol intake to no more than 1 drink per day for nonpregnant women. One drink equals 12 ounces of beer, 5 ounces of wine, or 1 ounces of hard liquor.  Do not use street drugs.  Do not share needles.  Ask your health care provider for help if you need support or information about quitting drugs.  Tell your health care provider if you often feel depressed.  Tell your health care provider if you have ever been abused or do not feel safe at home. This information is not intended to replace advice given to you by your health care provider. Make sure you discuss any questions you have with your health care provider. Document Released: 09/21/2010 Document Revised: 08/14/2015 Document Reviewed: 12/10/2014 Elsevier Interactive Patient Education  2018 [employer]  

## 2017-05-25 LAB — PAP IG W/ RFLX HPV ASCU

## 2017-05-25 LAB — HUMAN PAPILLOMAVIRUS, HIGH RISK: HPV DNA High Risk: DETECTED — AB

## 2017-05-31 ENCOUNTER — Telehealth: Payer: Self-pay

## 2017-05-31 NOTE — Telephone Encounter (Signed)
Patient said since office visit she had terrible itching vaginally. She had 2 Diflucan on hand but said the oral Rx does not usually help her. She took them both but still has just some slight itching as is on period. She asked if you would prescribed the Terazol 3 Vag Cream for her to use after her menses.  Ok?

## 2017-06-02 ENCOUNTER — Other Ambulatory Visit: Payer: Self-pay | Admitting: Obstetrics & Gynecology

## 2017-06-02 MED ORDER — TERCONAZOLE 0.8 % VA CREA: 1.0000 | TOPICAL_CREAM | Freq: Every day | VAGINAL | 0 refills | Status: DC

## 2017-06-02 NOTE — Telephone Encounter (Signed)
Rx sent. Patient informed. 

## 2017-06-02 NOTE — Telephone Encounter (Signed)
Yes, agree

## 2017-06-16 ENCOUNTER — Ambulatory Visit: Payer: BLUE CROSS/BLUE SHIELD | Admitting: Obstetrics & Gynecology

## 2017-06-16 ENCOUNTER — Encounter: Payer: Self-pay | Admitting: Obstetrics & Gynecology

## 2017-06-16 VITALS — BP 106/70

## 2017-06-16 DIAGNOSIS — R87810 Cervical high risk human papillomavirus (HPV) DNA test positive: Secondary | ICD-10-CM | POA: Diagnosis not present

## 2017-06-16 DIAGNOSIS — R87610 Atypical squamous cells of undetermined significance on cytologic smear of cervix (ASC-US): Secondary | ICD-10-CM | POA: Diagnosis not present

## 2017-06-16 DIAGNOSIS — Z113 Encounter for screening for infections with a predominantly sexual mode of transmission: Secondary | ICD-10-CM

## 2017-06-16 NOTE — Patient Instructions (Signed)
1. 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  Pending cervical biopsy as well as ECC.  Pending HPV 1618 and 45.  Pending gonorrhea and chlamydia.  Counseling on ASCUS and high risk HPV done.  Procedure reviewed with patient.  Will manage according to results.  Laura Wells, good seeing you today!   Colposcopy, Care After This sheet gives you information about how to care for yourself after your procedure. Your health care provider may also give you more specific instructions. If you have problems or questions, contact your health care provider. What can I expect after the procedure? If you had a colposcopy without a biopsy, you can expect to feel fine right away, but you may have some spotting for a few days. You can go back to your normal activities. If you had a colposcopy with a biopsy, it is common to have:  Soreness and pain. This may last for a few days.  Light-headedness.  Mild vaginal bleeding or dark-colored, grainy discharge. This may last for a few days. The discharge may be due to a solution that was used during the procedure. You may need to wear a sanitary pad during this time.  Spotting for at least 48 hours after the procedure.  Follow these instructions at home:  Take over-the-counter and prescription medicines only as told by your health care provider. Talk with your health care provider about what type of over-the-counter pain medicine and prescription medicine you can start taking again. It is especially important to talk with your health care provider if you take blood-thinning medicine.  Do not drive or use heavy machinery while taking prescription pain medicine.  For at least 3 days after your procedure, or as long as told by your health care provider, avoid: ? Douching. ? Using tampons. ? Having sexual intercourse.  Continue to use birth control (contraception).  Limit your physical activity for the first day after the  procedure as told by your health care provider. Ask your health care provider what activities are safe for you.  It is up to you to get the results of your procedure. Ask your health care provider, or the department performing the procedure, when your results will be ready.  Keep all follow-up visits as told by your health care provider. This is important. Contact a health care provider if:  You develop a skin rash. Get help right away if:  You are bleeding heavily from your vagina or you are passing blood clots. This includes using more than one sanitary pad per hour for 2 hours in a row.  You have a fever or chills.  You have pelvic pain.  You have abnormal, yellow-colored, or bad-smelling vaginal discharge. This could be a sign of infection.  You have severe pain or cramps in your lower abdomen that do not get better with medicine.  You feel light-headed or dizzy, or you faint. Summary  If you had a colposcopy without a biopsy, you can expect to feel fine right away, but you may have some spotting for a few days. You can go back to your normal activities.  If you had a colposcopy with a biopsy, you may notice mild pain and spotting for 48 hours after the procedure.  Avoid douching, using tampons, and having sexual intercourse for 3 days after the procedure or as long as told by your health care provider.  Contact your health care provider if you have bleeding, severe pain, or signs of infection. This  information is not intended to replace advice given to you by your health care provider. Make sure you discuss any questions you have with your health care provider. Document Released: 12/27/2012 Document Revised: 10/24/2015 Document Reviewed: 10/24/2015 Elsevier Interactive Patient Education  2018 [employer].

## 2017-06-16 NOTE — Addendum Note (Signed)
Addended by: Berna SpareASTILLO, Chattie Greeson A on: 06/16/2017 12:35 PM   Modules accepted: Orders

## 2017-06-16 NOTE — Progress Notes (Signed)
    Laura Wells [birth year] [id number]        42 y.o.  G3P3L3  Married.  Vasectomy.  RP: ASCUS/HPV HR pos for Colposcopy  HPI:  Previous h/o LGSIL and HR HPV positive.  But the last Pap was negative with HR HPV neg 12/2015.  Normal vaginal secretions after treating for yeast vaginitis.  No pelvic pain.   OB History  Gravida Para Term Preterm AB Living  3 3       3   SAB TAB Ectopic Multiple Live Births               # Outcome Date GA Lbr Len/2nd Weight Sex Delivery Anes PTL Lv  3 Para           2 Para           1 Para             Past medical history,surgical history, problem list, medications, allergies, family history and social history were all reviewed and documented in the EPIC chart.   Directed ROS with pertinent positives and negatives documented in the history of present illness/assessment and plan.  Exam:  Vitals:   06/16/17 1136  BP: 106/70   General appearance:  Normal  Colposcopy Procedure Note Chrissa Bays 06/16/2017  Indications:  ASCUS/HR HPV positive  Procedure Details  The risks and benefits of the procedure and Verbal informed consent obtained.  Speculum placed in vagina and excellent visualization of cervix achieved, cervix swabbed x 3 with acetic acid solution.  Findings:  Cervix colposcopy: Physical Exam  Genitourinary:      Vaginal colposcopy: Normal  Vulvar colposcopy: Grossly normal  Perirectal colposcopy: Grossly normal  The cervix was sprayed with Hurricane before performing the cervical biopsies.  Specimens: HPV 16-18-45.  Gono-Chlam.  Cervical Biopsy at 6 O'clock. ECC.  Complications:  None, hemostasis with Silver Nitrate. . Plan:  Management per results.    Assessment/Plan:  42 y.o. G3P3   1. 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  Pending cervical biopsy as well as ECC.  Pending HPV 1618 and 45.  Pending gonorrhea and chlamydia.  Counseling on ASCUS and high risk HPV  done.  Procedure reviewed with patient.  Will manage according to results.  Genia DelMarie-Lyne [place] MD, 11:55 AM 06/16/2017

## 2017-06-17 LAB — C. TRACHOMATIS/N. GONORRHOEAE RNA
C. trachomatis RNA, TMA: NOT DETECTED
N. GONORRHOEAE RNA, TMA: NOT DETECTED

## 2017-06-20 LAB — TISSUE PATH REPORT

## 2017-06-20 LAB — HPV TYPE 16 AND 18/45 RNA
HPV Type 16 RNA: NOT DETECTED
HPV Type 18/45 RNA: NOT DETECTED

## 2017-06-20 LAB — PATHOLOGY

## 2017-10-26 DIAGNOSIS — M79642 Pain in left hand: Secondary | ICD-10-CM | POA: Diagnosis not present

## 2018-01-02 ENCOUNTER — Telehealth: Payer: Self-pay | Admitting: *Deleted

## 2018-01-02 NOTE — Telephone Encounter (Signed)
Patient called left message in triage voicemail c/o BV symptoms, I called and left detailed message on cell to schedule OV.

## 2018-01-03 ENCOUNTER — Ambulatory Visit: Payer: BLUE CROSS/BLUE SHIELD | Admitting: Obstetrics & Gynecology

## 2018-01-03 ENCOUNTER — Encounter: Payer: Self-pay | Admitting: Obstetrics & Gynecology

## 2018-01-03 VITALS — BP 112/72

## 2018-01-03 DIAGNOSIS — N870 Mild cervical dysplasia: Secondary | ICD-10-CM

## 2018-01-03 DIAGNOSIS — N898 Other specified noninflammatory disorders of vagina: Secondary | ICD-10-CM

## 2018-01-03 DIAGNOSIS — R87610 Atypical squamous cells of undetermined significance on cytologic smear of cervix (ASC-US): Secondary | ICD-10-CM | POA: Diagnosis not present

## 2018-01-03 LAB — WET PREP FOR TRICH, YEAST, CLUE

## 2018-01-03 MED ORDER — METRONIDAZOLE 0.75 % VA GEL: 1.0000 | Freq: Every day | VAGINAL | 2 refills | Status: AC

## 2018-01-03 NOTE — Patient Instructions (Signed)
1. Vaginal odor Bacterial vaginosis clinically and confirmed by wet prep.  Decision to treat with MetroGel.  Usage reviewed and prescription sent to pharmacy. - WET PREP FOR TRICH, YEAST, CLUE  2. 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  High risk HPV positive, but HPV 16-18-45 negative.  6 month Pap test with high risk HPV done today.  Other orders - metroNIDAZOLE (METROGEL) 0.75 % vaginal gel; Place 1 Applicatorful vaginally daily for 5 days.  Laura Wells, good seeing you today!  I will inform you of your results as soon as they are available.

## 2018-01-03 NOTE — Addendum Note (Signed)
Addended by: Aura Camps on: 01/03/2018 12:55 PM   Modules accepted: Orders

## 2018-01-03 NOTE — Progress Notes (Signed)
    Laura Wells 1975-09-28 [id number]        42 y.o.  G3P3L3 Married  RP: Vaginal odors for 2 days/41-month Pap test  HPI: Complains of vaginal odors, especially after intercourse with contact with sperm.  No pelvic pain.  Normal menstrual periods every month.  ASCUS with high risk HPV positive on last Pap test.  Colposcopy in March 2019 showed mild dysplasia.   OB History  Gravida Para Term Preterm AB Living  3 3       3   SAB TAB Ectopic Multiple Live Births               # Outcome Date GA Lbr Len/2nd Weight Sex Delivery Anes PTL Lv  3 Para           2 Para           1 Para             Past medical history,surgical history, problem list, medications, allergies, family history and social history were all reviewed and documented in the EPIC chart.   Directed ROS with pertinent positives and negatives documented in the history of present illness/assessment and plan.  Exam:  Vitals:   01/03/18 1058  BP: 112/72   General appearance:  Normal  Abdomen: Normal  Gynecologic exam: Vulva normal.  Speculum: Cervix and vagina normal.  Increased vaginal discharge.  Wet prep done.  Pap/HPV HR done.  Wet prep: Clue cells present   Assessment/Plan:  42 y.o. G3P3L3  1. Vaginal odor Bacterial vaginosis clinically and confirmed by wet prep.  Decision to treat with MetroGel.  Usage reviewed and prescription sent to pharmacy. - WET PREP FOR TRICH, YEAST, CLUE  2. 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  High risk HPV positive, but HPV 16-18-45 negative.  6 month Pap test with high risk HPV done today.  Other orders - metroNIDAZOLE (METROGEL) 0.75 % vaginal gel; Place 1 Applicatorful vaginally daily for 5 days.  Counseling on above issues and coordination of care more than 50% for 15 minutes.  Laura Del MD, 11:11 AM 01/03/2018

## 2018-01-10 LAB — PAP, TP IMAGING W/ HPV RNA, RFLX HPV TYPE 16,18/45: HPV DNA HIGH RISK: NOT DETECTED

## 2018-02-02 DIAGNOSIS — J209 Acute bronchitis, unspecified: Secondary | ICD-10-CM | POA: Diagnosis not present

## 2018-02-23 DIAGNOSIS — R05 Cough: Secondary | ICD-10-CM | POA: Diagnosis not present

## 2018-03-24 ENCOUNTER — Ambulatory Visit: Payer: BLUE CROSS/BLUE SHIELD | Admitting: Obstetrics & Gynecology

## 2018-04-10 DIAGNOSIS — Z8632 Personal history of gestational diabetes: Secondary | ICD-10-CM | POA: Diagnosis not present

## 2018-04-10 DIAGNOSIS — E785 Hyperlipidemia, unspecified: Secondary | ICD-10-CM | POA: Diagnosis not present

## 2018-04-10 DIAGNOSIS — E559 Vitamin D deficiency, unspecified: Secondary | ICD-10-CM | POA: Diagnosis not present

## 2018-04-10 DIAGNOSIS — Z0000 Encounter for general adult medical examination without abnormal findings: Secondary | ICD-10-CM | POA: Diagnosis not present

## 2018-04-10 DIAGNOSIS — E049 Nontoxic goiter, unspecified: Secondary | ICD-10-CM | POA: Diagnosis not present

## 2018-05-25 ENCOUNTER — Ambulatory Visit: Payer: BLUE CROSS/BLUE SHIELD | Admitting: Obstetrics & Gynecology

## 2018-05-25 ENCOUNTER — Encounter: Payer: Self-pay | Admitting: Obstetrics & Gynecology

## 2018-05-25 VITALS — BP 120/76 | Ht 61.25 in | Wt 116.0 lb

## 2018-05-25 DIAGNOSIS — Z9189 Other specified personal risk factors, not elsewhere classified: Secondary | ICD-10-CM | POA: Diagnosis not present

## 2018-05-25 DIAGNOSIS — N870 Mild cervical dysplasia: Secondary | ICD-10-CM

## 2018-05-25 DIAGNOSIS — Z01419 Encounter for gynecological examination (general) (routine) without abnormal findings: Secondary | ICD-10-CM

## 2018-05-25 DIAGNOSIS — Z1151 Encounter for screening for human papillomavirus (HPV): Secondary | ICD-10-CM

## 2018-05-25 NOTE — Patient Instructions (Signed)
1. Encounter for routine gynecological examination with Papanicolaou smear of cervix Normal gynecologic exam.  Pap test with high-risk HPV done today.  Breast exam normal.  Will schedule screening mammogram now.  Health labs with family physician.  Good body mass index at 21.74.  Continue with regular physical activity and healthy nutrition.  Recommend increasing vegetables and decreasing red meat.  2. Relies on partner vasectomy for contraception  3. Dysplasia of cervix, low grade (CIN 1) Pap test with high-risk HPV done today.  Laura Wells, it was a pleasure seeing you today!  I will inform you of your results as soon as they are available.

## 2018-05-25 NOTE — Progress Notes (Signed)
Laura Wells 10-13-1975 [id number]   History:    43 y.o. G3P3L3 Married.  Vasectomy.  Children 14-17-19  RP:  Established patient presenting for annual gyn exam   HPI: Normal menstrual periods with light flow every 3 to 4 weeks.  Mild feeling of dryness vaginally after her periods.  No breakthrough bleeding.  No pelvic pain.  No pain with intercourse.  Breast normal.  Body mass index 21.74.  Good nutrition, except for a lot of red meat.  Exercises regularly.  Health labs with family physician.  Past medical history,surgical history, family history and social history were all reviewed and documented in the EPIC chart.  Gynecologic History Patient's last menstrual period was 05/19/2018. Contraception: vasectomy Last Pap: 05/2017. Results were: ASCUS/HPV HR pos.  Colpo 05/2017 CIN 1 Last mammogram: 2017. Results were: normal per patient Bone Density: Never Colonoscopy: Never  Obstetric History OB History  Gravida Para Term Preterm AB Living  3 3       3   SAB TAB Ectopic Multiple Live Births               # Outcome Date GA Lbr Len/2nd Weight Sex Delivery Anes PTL Lv  3 Para           2 Para           1 Para              ROS: A ROS was performed and pertinent positives and negatives are included in the history.  GENERAL: No fevers or chills. HEENT: No change in vision, no earache, sore throat or sinus congestion. NECK: No pain or stiffness. CARDIOVASCULAR: No chest pain or pressure. No palpitations. PULMONARY: No shortness of breath, cough or wheeze. GASTROINTESTINAL: No abdominal pain, nausea, vomiting or diarrhea, melena or bright red blood per rectum. GENITOURINARY: No urinary frequency, urgency, hesitancy or dysuria. MUSCULOSKELETAL: No joint or muscle pain, no back pain, no recent trauma. DERMATOLOGIC: No rash, no itching, no lesions. ENDOCRINE: No polyuria, polydipsia, no heat or cold intolerance. No recent change in weight. HEMATOLOGICAL: No anemia or easy bruising or  bleeding. NEUROLOGIC: No headache, seizures, numbness, tingling or weakness. PSYCHIATRIC: No depression, no loss of interest in normal activity or change in sleep pattern.     Exam:   BP 120/76   Ht 5' 1.25" (1.556 m)   Wt 116 lb (52.6 kg)   LMP 05/19/2018   BMI 21.74 kg/m   Body mass index is 21.74 kg/m.  General appearance : Well developed well nourished female. No acute distress HEENT: Eyes: no retinal hemorrhage or exudates,  Neck supple, trachea midline, no carotid bruits, no thyroidmegaly Lungs: Clear to auscultation, no rhonchi or wheezes, or rib retractions  Heart: Regular rate and rhythm, no murmurs or gallops Breast:Examined in sitting and supine position were symmetrical in appearance, no palpable masses or tenderness,  no skin retraction, no nipple inversion, no nipple discharge, no skin discoloration, no axillary or supraclavicular lymphadenopathy Abdomen: no palpable masses or tenderness, no rebound or guarding Extremities: no edema or skin discoloration or tenderness  Pelvic: Vulva: Normal             Vagina: No gross lesions or discharge  Cervix: No gross lesions or discharge.  Pap/HPV HR done.  Uterus  AV, normal size, shape and consistency, non-tender and mobile  Adnexa  Without masses or tenderness  Anus: Normal   Assessment/Plan:  43 y.o. female for annual exam   1. Encounter for routine gynecological  examination with Papanicolaou smear of cervix Normal gynecologic exam.  Pap test with high-risk HPV done today.  Breast exam normal.  Will schedule screening mammogram now.  Health labs with family physician.  Good body mass index at 21.74.  Continue with regular physical activity and healthy nutrition.  Recommend increasing vegetables and decreasing red meat.  2. Relies on partner vasectomy for contraception  3. Dysplasia of cervix, low grade (CIN 1) Pap test with high-risk HPV done today.  [name], 12:28 PM 05/25/2018

## 2018-05-26 LAB — PAP, TP IMAGING W/ HPV RNA, RFLX HPV TYPE 16,18/45: HPV DNA HIGH RISK: NOT DETECTED

## 2018-09-25 DIAGNOSIS — J302 Other seasonal allergic rhinitis: Secondary | ICD-10-CM | POA: Diagnosis not present

## 2018-09-25 DIAGNOSIS — R05 Cough: Secondary | ICD-10-CM | POA: Diagnosis not present

## 2018-10-31 ENCOUNTER — Other Ambulatory Visit: Payer: Self-pay

## 2018-11-01 ENCOUNTER — Ambulatory Visit: Payer: BLUE CROSS/BLUE SHIELD | Admitting: Obstetrics & Gynecology

## 2018-11-06 ENCOUNTER — Other Ambulatory Visit: Payer: Self-pay

## 2018-11-07 ENCOUNTER — Encounter: Payer: Self-pay | Admitting: Obstetrics & Gynecology

## 2018-11-07 ENCOUNTER — Ambulatory Visit (INDEPENDENT_AMBULATORY_CARE_PROVIDER_SITE_OTHER): Payer: BC Managed Care – PPO | Admitting: Obstetrics & Gynecology

## 2018-11-07 VITALS — BP 122/78

## 2018-11-07 DIAGNOSIS — R8290 Unspecified abnormal findings in urine: Secondary | ICD-10-CM | POA: Diagnosis not present

## 2018-11-07 DIAGNOSIS — N898 Other specified noninflammatory disorders of vagina: Secondary | ICD-10-CM | POA: Diagnosis not present

## 2018-11-07 LAB — WET PREP FOR TRICH, YEAST, CLUE

## 2018-11-07 MED ORDER — TINIDAZOLE 500 MG PO TABS: 2.0000 g | ORAL_TABLET | Freq: Every day | ORAL | 1 refills | Status: AC

## 2018-11-07 MED ORDER — FLUCONAZOLE 150 MG PO TABS: 150.0000 mg | ORAL_TABLET | Freq: Every day | ORAL | 1 refills | Status: AC

## 2018-11-07 NOTE — Progress Notes (Signed)
    Laura Wells 1976/02/22 [id number]        43 y.o.  G3P3L3 Married.  Vasectomy.  RP:  Vaginal and urine odor intermittently after menses  HPI: Vaginal odor with thick vaginal discharge worse after the menstrual period.  No pelvic pain.  Menses regular normal every month. No BTB.  Odor associated with urine as well.  No pain with urination, no urinary frequency, no blood in urine.  No fever.   OB History  Gravida Para Term Preterm AB Living  3 3       3   SAB TAB Ectopic Multiple Live Births               # Outcome Date GA Lbr Len/2nd Weight Sex Delivery Anes PTL Lv  3 Para           2 Para           1 Para             Past medical history,surgical history, problem list, medications, allergies, family history and social history were all reviewed and documented in the EPIC chart.   Directed ROS with pertinent positives and negatives documented in the history of present illness/assessment and plan.  Exam:  Vitals:   11/07/18 1135  BP: 122/78   General appearance:  Normal  CVAT negative bilaterally  Abdomen: Normal  Gynecologic exam: Vulva normal.  Speculum:  Cervix/vagina normal.  Increased thick vaginal discharge.  Wet prep done.  U/A: Completely negative  Wet prep: Clue cells present   Assessment/Plan:  43 y.o. G3P3L3  1. Vaginal odor Bacterial vaginosis confirmed by wet prep.  Will treat with tinidazole 2 tablets twice a day for 2 days.  Usage reviewed with patient and prescription sent to pharmacy.  Will take fluconazole after the antibiotics to treat to prevent a yeast infection.  Recommend a probiotic tablet vaginally once a week for prevention.  Refills given for both as patient experiences recurrences with her menstrual cycle. - Urinalysis,Complete w/RFL Culture - WET PREP FOR [place], YEAST, CLUE  2. Abnormal urine odor Urine analysis completely negative, patient reassured.  Other orders - tinidazole (TINDAMAX) 500 MG tablet; Take 4 tablets (2,000 mg  total) by mouth daily for 2 doses. - fluconazole (DIFLUCAN) 150 MG tablet; Take 1 tablet (150 mg total) by mouth daily for 3 days.  Counseling on above issues and coordination of care more than 50% for 25 minutes.  Princess Bruins MD, 11:46 AM 11/07/2018

## 2018-11-08 LAB — URINALYSIS, COMPLETE W/RFL CULTURE
Bacteria, UA: NONE SEEN /HPF
Bilirubin Urine: NEGATIVE
Glucose, UA: NEGATIVE
Hgb urine dipstick: NEGATIVE
Hyaline Cast: NONE SEEN /LPF
Ketones, ur: NEGATIVE
Leukocyte Esterase: NEGATIVE
Nitrites, Initial: NEGATIVE
Protein, ur: NEGATIVE
RBC / HPF: NONE SEEN /HPF (ref 0–2)
Specific Gravity, Urine: 1.01 (ref 1.001–1.03)
WBC, UA: NONE SEEN /HPF (ref 0–5)
pH: 7 (ref 5.0–8.0)

## 2018-11-08 LAB — NO CULTURE INDICATED

## 2018-11-10 ENCOUNTER — Telehealth: Payer: Self-pay | Admitting: *Deleted

## 2018-11-10 NOTE — Telephone Encounter (Signed)
Patient called to follow up from [facility] on 11/07/18 completed the medication for BV, but still notes strong urine smell, patient said she only notices the odor with urination, reports urine smells salty and appears to be stronger. Color of urine is normal yellow, no burning,having frequency,but is drinking plenty of water. She did have urine done at [facility] it was normal, I did relay this to the patient. She is worried what this could be because she has family history of diabetes. Patient would like any recommendations. Please advise

## 2018-11-13 ENCOUNTER — Encounter: Payer: Self-pay | Admitting: Obstetrics & Gynecology

## 2018-11-13 NOTE — Patient Instructions (Signed)
1. Vaginal odor Bacterial vaginosis confirmed by wet prep.  Will treat with tinidazole 2 tablets twice a day for 2 days.  Usage reviewed with patient and prescription sent to pharmacy.  Will take fluconazole after the antibiotics to treat to prevent a yeast infection.  Recommend a probiotic tablet vaginally once a week for prevention.  Refills given for both as patient experiences recurrences with her menstrual cycle. - Urinalysis,Complete w/RFL Culture - WET PREP FOR [name], YEAST, CLUE  2. Abnormal urine odor Urine analysis completely negative, patient reassured.  Other orders - tinidazole (TINDAMAX) 500 MG tablet; Take 4 tablets (2,000 mg total) by mouth daily for 2 doses. - fluconazole (DIFLUCAN) 150 MG tablet; Take 1 tablet (150 mg total) by mouth daily for 3 days.  Laura Wells, it was a pleasure seeing you today!

## 2018-11-14 NOTE — Telephone Encounter (Signed)
Patient informed. 

## 2018-11-14 NOTE — Telephone Encounter (Signed)
Recommend observation.  Probiotic tab vaginally daily x 2 weeks.  If worried about DM, see Fam MD.

## 2019-01-23 ENCOUNTER — Encounter: Payer: Self-pay | Admitting: Women's Health

## 2019-01-23 ENCOUNTER — Ambulatory Visit: Payer: BC Managed Care – PPO | Admitting: Women's Health

## 2019-01-23 ENCOUNTER — Other Ambulatory Visit: Payer: Self-pay

## 2019-01-23 ENCOUNTER — Telehealth: Payer: Self-pay

## 2019-01-23 VITALS — BP 122/80

## 2019-01-23 DIAGNOSIS — N760 Acute vaginitis: Secondary | ICD-10-CM

## 2019-01-23 DIAGNOSIS — B9689 Other specified bacterial agents as the cause of diseases classified elsewhere: Secondary | ICD-10-CM

## 2019-01-23 DIAGNOSIS — N898 Other specified noninflammatory disorders of vagina: Secondary | ICD-10-CM

## 2019-01-23 LAB — WET PREP FOR TRICH, YEAST, CLUE

## 2019-01-23 MED ORDER — METRONIDAZOLE 500 MG PO TABS: 500.0000 mg | ORAL_TABLET | Freq: Two times a day (BID) | ORAL | 0 refills | Status: DC

## 2019-01-23 MED ORDER — FLUCONAZOLE 150 MG PO TABS: 150.0000 mg | ORAL_TABLET | Freq: Once | ORAL | 0 refills | Status: AC

## 2019-01-23 NOTE — Telephone Encounter (Signed)
Called complaining of vaginal infection, she suspects yeast and also a strange odor to her urine. Recommend office visit and appointment was scheduled.

## 2019-01-23 NOTE — Telephone Encounter (Signed)
Already had encounter opened. 

## 2019-01-23 NOTE — Patient Instructions (Addendum)
[employer]  Boric acid gel caps 600 mg twice weekly  Per VAGINA  Start boric acid after completing flagyl  Bacterial Vaginosis  Bacterial vaginosis is a vaginal infection that occurs when the normal balance of bacteria in the vagina is disrupted. It results from an overgrowth of certain bacteria. This is the most common vaginal infection among women ages 51-44. Because bacterial vaginosis increases your risk for STIs (sexually transmitted infections), getting treated can help reduce your risk for chlamydia, gonorrhea, herpes, and HIV (human immunodeficiency virus). Treatment is also important for preventing complications in pregnant women, because this condition can cause an early (premature) delivery. What are the causes? This condition is caused by an increase in harmful bacteria that are normally present in small amounts in the vagina. However, the reason that the condition develops is not fully understood. What increases the risk? The following factors may make you more likely to develop this condition:  Having a new sexual partner or multiple sexual partners.  Having unprotected sex.  Douching.  Having an intrauterine device (IUD).  Smoking.  Drug and alcohol abuse.  Taking certain antibiotic medicines.  Being pregnant. You cannot get bacterial vaginosis from toilet seats, bedding, swimming pools, or contact with objects around you. What are the signs or symptoms? Symptoms of this condition include:  Grey or white vaginal discharge. The discharge can also be watery or foamy.  A fish-like odor with discharge, especially after sexual intercourse or during menstruation.  Itching in and around the vagina.  Burning or pain with urination. Some women with bacterial vaginosis have no signs or symptoms. How is this diagnosed? This condition is diagnosed based on:  Your medical history.  A physical exam of the vagina.  Testing a sample of vaginal fluid under a microscope to look  for a large amount of bad bacteria or abnormal cells. Your health care provider may use a cotton swab or a small wooden spatula to collect the sample. How is this treated? This condition is treated with antibiotics. These may be given as a pill, a vaginal cream, or a medicine that is put into the vagina (suppository). If the condition comes back after treatment, a second round of antibiotics may be needed. Follow these instructions at home: Medicines  Take over-the-counter and prescription medicines only as told by your health care provider.  Take or use your antibiotic as told by your health care provider. Do not stop taking or using the antibiotic even if you start to feel better. General instructions  If you have a female sexual partner, tell her that you have a vaginal infection. She should see her health care provider and be treated if she has symptoms. If you have a female sexual partner, he does not need treatment.  During treatment: ? Avoid sexual activity until you finish treatment. ? Do not douche. ? Avoid alcohol as directed by your health care provider. ? Avoid breastfeeding as directed by your health care provider.  Drink enough water and fluids to keep your urine clear or pale yellow.  Keep the area around your vagina and rectum clean. ? Wash the area daily with warm water. ? Wipe yourself from front to back after using the toilet.  Keep all follow-up visits as told by your health care provider. This is important. How is this prevented?  Do not douche.  Wash the outside of your vagina with warm water only.  Use protection when having sex. This includes latex condoms and dental dams.  Limit  how many sexual partners you have. To help prevent bacterial vaginosis, it is best to have sex with just one partner (monogamous).  Make sure you and your sexual partner are tested for STIs.  Wear cotton or cotton-lined underwear.  Avoid wearing tight pants and pantyhose,  especially during summer.  Limit the amount of alcohol that you drink.  Do not use any products that contain nicotine or tobacco, such as cigarettes and e-cigarettes. If you need help quitting, ask your health care provider.  Do not use illegal drugs. Where to find more information  Centers for Disease Control and Prevention: AppraiserFraud.fi  American Sexual Health Association (ASHA): www.ashastd.org  U.S. Department of Health and Financial controller, Office on Women's Health: DustingSprays.pl or SecuritiesCard.it Contact a health care provider if:  Your symptoms do not improve, even after treatment.  You have more discharge or pain when urinating.  You have a fever.  You have pain in your abdomen.  You have pain during sex.  You have vaginal bleeding between periods. Summary  Bacterial vaginosis is a vaginal infection that occurs when the normal balance of bacteria in the vagina is disrupted.  Because bacterial vaginosis increases your risk for STIs (sexually transmitted infections), getting treated can help reduce your risk for chlamydia, gonorrhea, herpes, and HIV (human immunodeficiency virus). Treatment is also important for preventing complications in pregnant women, because the condition can cause an early (premature) delivery.  This condition is treated with antibiotic medicines. These may be given as a pill, a vaginal cream, or a medicine that is put into the vagina (suppository). This information is not intended to replace advice given to you by your health care provider. Make sure you discuss any questions you have with your health care provider. Document Released: 03/08/2005 Document Revised: 02/18/2017 Document Reviewed: 11/22/2015 Elsevier Patient Education  2020 [employer].

## 2019-01-23 NOTE — Progress Notes (Signed)
43 year old MF G3, P3 presents with complaint of urinary or vaginal odor especially week after menses.  States feels she has BV again, was treated for BV with Tindamax 10/2018 had relief of symptoms after taking medication but symptoms returned after cycle.  Denies vaginal itching, urinary symptoms of pain, burning, urgency or pain at end of stream of urination, has occasional frequency.  Has used a probiotic with some relief of symptoms.  Regular monthly cycle until skipped September cycle, normal October cycle/vasectomy.  Denies menopausal symptoms.  No known medical problems, healthy lifestyle of regular exercise and healthy diet..  Exam: Appears well.  No CVAT.  External genitalia within normal limits, speculum exam moderate white adherent discharge noted, wet prep positive for clues, TNTC bacteria.  Bimanual no CMT or adnexal tenderness.  BV  Plan: Options reviewed, Flagyl 500 twice daily for 7 days, alcohol precautions reviewed.  Diflucan 150 mg 1 dose if vaginal itching after completing Flagyl.  Options for prevention discussed, will try boric acid gelcaps 600 mg per vagina twice weekly after completing Flagyl.  Instructed to call if continued problems.

## 2019-02-08 ENCOUNTER — Telehealth: Payer: Self-pay | Admitting: *Deleted

## 2019-02-08 DIAGNOSIS — B9689 Other specified bacterial agents as the cause of diseases classified elsewhere: Secondary | ICD-10-CM

## 2019-02-08 NOTE — Telephone Encounter (Signed)
Patient was seen and treated for BV infection with Flagyl 500 mg tablet bid x 7 days. She completed Rx, however had cycle last week and now BV infection has returned, vaginal odor only, not itching. Patient reports recurrent BV infection after cycle, did try probiotic vaginal tablet.  Patient asked if refill on flagyl could be sent to pharmacy?

## 2019-02-09 MED ORDER — METRONIDAZOLE 500 MG PO TABS: 500.0000 mg | ORAL_TABLET | Freq: Two times a day (BID) | ORAL | 0 refills | Status: DC

## 2019-02-09 NOTE — Telephone Encounter (Signed)
Agree with Flagyl represcription.

## 2019-02-09 NOTE — Telephone Encounter (Signed)
Patient informed. Rx sent 

## 2019-03-31 ENCOUNTER — Other Ambulatory Visit: Payer: Self-pay | Admitting: *Deleted

## 2019-03-31 DIAGNOSIS — Z1152 Encounter for screening for COVID-19: Secondary | ICD-10-CM | POA: Diagnosis not present

## 2019-04-01 LAB — NOVEL CORONAVIRUS, NAA: SARS-CoV-2, NAA: DETECTED — AB

## 2019-04-02 ENCOUNTER — Telehealth: Payer: Self-pay | Admitting: Family Medicine

## 2019-04-02 NOTE — Telephone Encounter (Signed)
  Lab Results  Component Value Date   SARSCOV2NAA Detected (A) 03/31/2019   Patient called about Positive Covid test from 1/9/2021testing event. The patient has mild upper respiratory symptoms and no fever. Symptoms are mild at this time. Patient understands the needs to stay in isolation for a total of 10 days from onset of symptom or 14 days total from date of testing if no symptom. Patient knows the health department may be in touch.  I answered all of patient's questions and all concerns addressed.  Nolon Nations  APRN, MSN, FNP-C Patient Care [facility] [address] [place], [place] [address] [phone]

## 2019-04-10 ENCOUNTER — Other Ambulatory Visit: Payer: Self-pay | Admitting: Cardiology

## 2019-04-10 DIAGNOSIS — Z20822 Contact with and (suspected) exposure to covid-19: Secondary | ICD-10-CM

## 2019-04-11 LAB — NOVEL CORONAVIRUS, NAA: SARS-CoV-2, NAA: NOT DETECTED

## 2019-05-08 ENCOUNTER — Encounter: Payer: Self-pay | Admitting: Women's Health

## 2019-05-08 ENCOUNTER — Other Ambulatory Visit: Payer: Self-pay

## 2019-05-08 ENCOUNTER — Ambulatory Visit (INDEPENDENT_AMBULATORY_CARE_PROVIDER_SITE_OTHER): Payer: BC Managed Care – PPO | Admitting: Women's Health

## 2019-05-08 VITALS — BP 118/80

## 2019-05-08 DIAGNOSIS — R350 Frequency of micturition: Secondary | ICD-10-CM | POA: Diagnosis not present

## 2019-05-08 DIAGNOSIS — B9689 Other specified bacterial agents as the cause of diseases classified elsewhere: Secondary | ICD-10-CM | POA: Diagnosis not present

## 2019-05-08 DIAGNOSIS — N760 Acute vaginitis: Secondary | ICD-10-CM | POA: Diagnosis not present

## 2019-05-08 MED ORDER — METRONIDAZOLE 500 MG PO TABS: 500.0000 mg | ORAL_TABLET | Freq: Two times a day (BID) | ORAL | 0 refills | Status: DC

## 2019-05-08 NOTE — Progress Notes (Signed)
44 year old and G3 P3 presents with complaint of vaginal discharge with odor and irritation that occurred after last cycle.  Reports think she has a another bacterial vaginosis.  States she has BV symptoms of vaginal irritation with odor most months after cycle.  Denies vaginal itching, urinary pain/ burning symptoms, abdominal pain or fever.  Occasional urinary frequency without pain at end of stream of urination.  Regular monthly 5-day cycle/vasectomy.  Rare menopausal symptoms.  No medical problems.  Healthy lifestyle of regular exercise and proper diet.  Exam: Appears well.  No CVAT.  Abdomen soft without rebound or radiation, external genitalia within normal limits, speculum exam moderate amount of a white adherent discharge with odor noted, wet prep positive for clues, TNTC bacteria.  Bimanual no CMT or adnexal tenderness. UA: Negative nitrites, negative leukocytes, no WBCs, no RBCs, 6-10 squamous epithelials, no bacteria  Recurrent bacterial vaginosis  Plan: Flagyl 500 twice daily for 7 days, alcohol precautions reviewed.  Options for boric acid gelcaps vaginally for suppression discussed will get on Amazon place 1 per vagina twice weekly after completing Flagyl.  Instructed to call if recurrence or no relief of symptoms.  Reviewed normality of UA.

## 2019-05-08 NOTE — Patient Instructions (Signed)
Boric acid gel caps per  VAGINA twice weekly  Get the boric acid on Amazon  Bacterial Vaginosis  Bacterial vaginosis is a vaginal infection that occurs when the normal balance of bacteria in the vagina is disrupted. It results from an overgrowth of certain bacteria. This is the most common vaginal infection among women ages 56-44. Because bacterial vaginosis increases your risk for STIs (sexually transmitted infections), getting treated can help reduce your risk for chlamydia, gonorrhea, herpes, and HIV (human immunodeficiency virus). Treatment is also important for preventing complications in pregnant women, because this condition can cause an early (premature) delivery. What are the causes? This condition is caused by an increase in harmful bacteria that are normally present in small amounts in the vagina. However, the reason that the condition develops is not fully understood. What increases the risk? The following factors may make you more likely to develop this condition:  Having a new sexual partner or multiple sexual partners.  Having unprotected sex.  Douching.  Having an intrauterine device (IUD).  Smoking.  Drug and alcohol abuse.  Taking certain antibiotic medicines.  Being pregnant. You cannot get bacterial vaginosis from toilet seats, bedding, swimming pools, or contact with objects around you. What are the signs or symptoms? Symptoms of this condition include:  Grey or white vaginal discharge. The discharge can also be watery or foamy.  A fish-like odor with discharge, especially after sexual intercourse or during menstruation.  Itching in and around the vagina.  Burning or pain with urination. Some women with bacterial vaginosis have no signs or symptoms. How is this diagnosed? This condition is diagnosed based on:  Your medical history.  A physical exam of the vagina.  Testing a sample of vaginal fluid under a microscope to look for a large amount of bad  bacteria or abnormal cells. Your health care provider may use a cotton swab or a small wooden spatula to collect the sample. How is this treated? This condition is treated with antibiotics. These may be given as a pill, a vaginal cream, or a medicine that is put into the vagina (suppository). If the condition comes back after treatment, a second round of antibiotics may be needed. Follow these instructions at home: Medicines  Take over-the-counter and prescription medicines only as told by your health care provider.  Take or use your antibiotic as told by your health care provider. Do not stop taking or using the antibiotic even if you start to feel better. General instructions  If you have a female sexual partner, tell her that you have a vaginal infection. She should see her health care provider and be treated if she has symptoms. If you have a female sexual partner, he does not need treatment.  During treatment: ? Avoid sexual activity until you finish treatment. ? Do not douche. ? Avoid alcohol as directed by your health care provider. ? Avoid breastfeeding as directed by your health care provider.  Drink enough water and fluids to keep your urine clear or pale yellow.  Keep the area around your vagina and rectum clean. ? Wash the area daily with warm water. ? Wipe yourself from front to back after using the toilet.  Keep all follow-up visits as told by your health care provider. This is important. How is this prevented?  Do not douche.  Wash the outside of your vagina with warm water only.  Use protection when having sex. This includes latex condoms and dental dams.  Limit how many sexual partners  you have. To help prevent bacterial vaginosis, it is best to have sex with just one partner (monogamous).  Make sure you and your sexual partner are tested for STIs.  Wear cotton or cotton-lined underwear.  Avoid wearing tight pants and pantyhose, especially during  summer.  Limit the amount of alcohol that you drink.  Do not use any products that contain nicotine or tobacco, such as cigarettes and e-cigarettes. If you need help quitting, ask your health care provider.  Do not use illegal drugs. Where to find more information  Centers for Disease Control and Prevention: AppraiserFraud.fi  American Sexual Health Association (ASHA): www.ashastd.org  U.S. Department of Health and Financial controller, Office on Women's Health: DustingSprays.pl or SecuritiesCard.it Contact a health care provider if:  Your symptoms do not improve, even after treatment.  You have more discharge or pain when urinating.  You have a fever.  You have pain in your abdomen.  You have pain during sex.  You have vaginal bleeding between periods. Summary  Bacterial vaginosis is a vaginal infection that occurs when the normal balance of bacteria in the vagina is disrupted.  Because bacterial vaginosis increases your risk for STIs (sexually transmitted infections), getting treated can help reduce your risk for chlamydia, gonorrhea, herpes, and HIV (human immunodeficiency virus). Treatment is also important for preventing complications in pregnant women, because the condition can cause an early (premature) delivery.  This condition is treated with antibiotic medicines. These may be given as a pill, a vaginal cream, or a medicine that is put into the vagina (suppository). This information is not intended to replace advice given to you by your health care provider. Make sure you discuss any questions you have with your health care provider. Document Revised: 02/18/2017 Document Reviewed: 11/22/2015 Elsevier Patient Education  2020 [employer].

## 2019-05-09 ENCOUNTER — Telehealth: Payer: Self-pay | Admitting: *Deleted

## 2019-05-09 DIAGNOSIS — N644 Mastodynia: Secondary | ICD-10-CM

## 2019-05-09 LAB — URINALYSIS, COMPLETE W/RFL CULTURE
Bacteria, UA: NONE SEEN /HPF
Bilirubin Urine: NEGATIVE
Glucose, UA: NEGATIVE
Hgb urine dipstick: NEGATIVE
Hyaline Cast: NONE SEEN /LPF
Ketones, ur: NEGATIVE
Leukocyte Esterase: NEGATIVE
Nitrites, Initial: NEGATIVE
Protein, ur: NEGATIVE
RBC / HPF: NONE SEEN /HPF (ref 0–2)
Specific Gravity, Urine: 1.025 (ref 1.001–1.03)
WBC, UA: NONE SEEN /HPF (ref 0–5)
pH: 6 (ref 5.0–8.0)

## 2019-05-09 LAB — WET PREP FOR TRICH, YEAST, CLUE

## 2019-05-09 LAB — NO CULTURE INDICATED

## 2019-05-09 NOTE — Telephone Encounter (Signed)
Patient scheduled at breast center on 05/22/19 @ 9:15am for bilateral diag. Mammogram and bilateral breast ultrasound as well.

## 2019-05-09 NOTE — Telephone Encounter (Signed)
-----   Message from Harrington Challenger, NP sent at 05/08/2019  4:25 PM EST ----- Has bilateral breast tenderness for 1 month, normal mammogram x1 unsure where?  No breast cancer family history.  Needs bilateral diag mammogram, can go anytime.  Breast center

## 2019-05-14 DIAGNOSIS — R05 Cough: Secondary | ICD-10-CM | POA: Diagnosis not present

## 2019-05-14 DIAGNOSIS — J302 Other seasonal allergic rhinitis: Secondary | ICD-10-CM | POA: Diagnosis not present

## 2019-05-22 ENCOUNTER — Telehealth: Payer: Self-pay | Admitting: *Deleted

## 2019-05-22 ENCOUNTER — Other Ambulatory Visit: Payer: BC Managed Care – PPO

## 2019-05-22 ENCOUNTER — Other Ambulatory Visit: Payer: Self-pay

## 2019-05-22 ENCOUNTER — Ambulatory Visit: Admission: RE | Admit: 2019-05-22 | Payer: BC Managed Care – PPO | Source: Ambulatory Visit

## 2019-05-22 ENCOUNTER — Ambulatory Visit
Admission: RE | Admit: 2019-05-22 | Discharge: 2019-05-22 | Disposition: A | Discharge status: Home / Self Care | Payer: BC Managed Care – PPO | Source: Ambulatory Visit | Attending: Women's Health | Admitting: Women's Health

## 2019-05-22 DIAGNOSIS — N644 Mastodynia: Secondary | ICD-10-CM

## 2019-05-22 DIAGNOSIS — R922 Inconclusive mammogram: Secondary | ICD-10-CM | POA: Diagnosis not present

## 2019-05-22 MED ORDER — METRONIDAZOLE 0.75 % VA GEL: 1.0000 | Freq: Every day | VAGINAL | 0 refills | Status: DC

## 2019-05-22 MED ORDER — FLUCONAZOLE 150 MG PO TABS: 150.0000 mg | ORAL_TABLET | Freq: Every day | ORAL | 0 refills | Status: DC

## 2019-05-22 NOTE — Telephone Encounter (Signed)
Patient informed, Rx's sent.  

## 2019-05-22 NOTE — Telephone Encounter (Signed)
Okay for Diflucan 150 mg.  And MetroGel 1 applicator at bedtime after each cycle.  Reviewed that one tube should have enough for 10 applications, wash off the applicators.  Have her call if she still has problems.

## 2019-05-22 NOTE — Telephone Encounter (Signed)
Patient was treated for BV on 05/08/19 and completed the Rx, cycle ended yesterday and now has vaginal itching only, no odor, no discharge. Patient said she was taking steroids for a cough at the time as well. Asked if you would be willing to send in Rx for yeast? And she did order boric acid supp online, due to to come soon. But worried since her cycle just ended recurrent BV will return. Asked if metronidazole could be sent in as well? Please advise

## 2019-05-24 ENCOUNTER — Ambulatory Visit: Payer: BC Managed Care – PPO | Admitting: Obstetrics & Gynecology

## 2019-05-24 ENCOUNTER — Encounter: Payer: Self-pay | Admitting: Obstetrics & Gynecology

## 2019-05-24 ENCOUNTER — Other Ambulatory Visit: Payer: Self-pay

## 2019-05-24 VITALS — BP 108/70

## 2019-05-24 DIAGNOSIS — B373 Candidiasis of vulva and vagina: Secondary | ICD-10-CM

## 2019-05-24 DIAGNOSIS — N898 Other specified noninflammatory disorders of vagina: Secondary | ICD-10-CM | POA: Diagnosis not present

## 2019-05-24 LAB — WET PREP FOR TRICH, YEAST, CLUE

## 2019-05-24 NOTE — Progress Notes (Signed)
    Laura Wells Jun 16, 1975 [id number]        44 y.o.  G3P3L3  RP: Vaginal itching with vaginal discharge  HPI: Treated for Bacterial Vaginosis in the past.  Currently no vaginal odors, but vaginal itching present with increased discharge.  Declines STI screen.  No pelvic pain.  No UTI Sx.  No fever.   OB History  Gravida Para Term Preterm AB Living  3 3       3   SAB TAB Ectopic Multiple Live Births               # Outcome Date GA Lbr Len/2nd Weight Sex Delivery Anes PTL Lv  3 Para           2 Para           1 Para             Past medical history,surgical history, problem list, medications, allergies, family history and social history were all reviewed and documented in the EPIC chart.   Directed ROS with pertinent positives and negatives documented in the history of present illness/assessment and plan.  Exam:  Vitals:   05/24/19 1532  BP: 108/70   General appearance:  Normal  Abdomen: Normal  Gynecologic exam: Vulva normal.  Speculum:  Cervix/vagina normal.  Increased thick secretion c/w yeasts.  Wet prep done.  Wet prep:  Yeasts present   Assessment/Plan:  44 y.o. G3P3L3  1. Vaginal discharge Vaginal itching and discharge with yeast vaginitis confirmed by wet prep.  Decision to treat with fluconazole 150 mg/tab. 1 tablet per mouth daily for 3 days.  Precautions reviewed. - WET PREP FOR TRICH, YEAST, CLUE  Other orders - fluconazole (DIFLUCAN) 150 MG tablet; Take 1 tablet (150 mg total) by mouth daily for 3 days.  44 MD, 4:00 PM 05/24/2019

## 2019-05-25 ENCOUNTER — Telehealth: Payer: Self-pay | Admitting: *Deleted

## 2019-05-25 MED ORDER — FLUCONAZOLE 150 MG PO TABS: 150.0000 mg | ORAL_TABLET | Freq: Every day | ORAL | 3 refills | Status: AC

## 2019-05-25 NOTE — Telephone Encounter (Signed)
Fluconazole sent.  Sorry, I put it in, just needed to press the send button.  Hyphae is the word I used.  Not concerning at all, it just means that the yeasts were numerous and they organize in that pattern when there are more present.

## 2019-05-25 NOTE — Telephone Encounter (Signed)
Patient called was seen yesterday for vaginal discharge, went to pharmacy last night and no Rx was there. Patient said you were going to prescribe diflucan tablets x 3 days and a refill? She also reports you uses a specific word about her infection and you appeared to be concerned, patient would like to know that "word"? Please advise

## 2019-05-25 NOTE — Telephone Encounter (Signed)
Patient informed with below.  

## 2019-05-30 ENCOUNTER — Encounter: Payer: Self-pay | Admitting: Obstetrics & Gynecology

## 2019-05-30 NOTE — Patient Instructions (Signed)
1. Vaginal discharge Vaginal itching and discharge with yeast vaginitis confirmed by wet prep.  Decision to treat with fluconazole 150 mg/tab. 1 tablet per mouth daily for 3 days.  Precautions reviewed. - WET PREP FOR TRICH, YEAST, CLUE  Other orders - fluconazole (DIFLUCAN) 150 MG tablet; Take 1 tablet (150 mg total) by mouth daily for 3 days.  Mariamawit, it was a pleasure seeing you today!

## 2019-07-12 ENCOUNTER — Encounter: Payer: BC Managed Care – PPO | Admitting: Obstetrics & Gynecology

## 2019-07-23 ENCOUNTER — Encounter: Payer: Self-pay | Admitting: Obstetrics & Gynecology

## 2019-07-23 ENCOUNTER — Other Ambulatory Visit: Payer: Self-pay

## 2019-07-23 ENCOUNTER — Ambulatory Visit (INDEPENDENT_AMBULATORY_CARE_PROVIDER_SITE_OTHER): Payer: BC Managed Care – PPO | Admitting: Obstetrics & Gynecology

## 2019-07-23 VITALS — BP 108/70 | Ht 61.25 in | Wt 117.0 lb

## 2019-07-23 DIAGNOSIS — Z1151 Encounter for screening for human papillomavirus (HPV): Secondary | ICD-10-CM | POA: Diagnosis not present

## 2019-07-23 DIAGNOSIS — Z9189 Other specified personal risk factors, not elsewhere classified: Secondary | ICD-10-CM | POA: Diagnosis not present

## 2019-07-23 DIAGNOSIS — Z01419 Encounter for gynecological examination (general) (routine) without abnormal findings: Secondary | ICD-10-CM | POA: Diagnosis not present

## 2019-07-23 DIAGNOSIS — N870 Mild cervical dysplasia: Secondary | ICD-10-CM | POA: Diagnosis not present

## 2019-07-23 NOTE — Progress Notes (Signed)
Laura Wells 04/12/1975 [id number]   History:    44 y.o. G3P3L3 Married.  Vasectomy.  Children 15-18-20  RP:  Established patient presenting for annual gyn exam   HPI: Normal menstrual periods with light flow every 3 to 4 weeks.  No breakthrough bleeding.  No pelvic pain.  No pain with intercourse.  Had Yeast Vaginitis, better with Boric Acid.  Breast normal.  Body mass index 21.93. Good nutrition.  Exercises regularly.  Health labs with family physician.   Past medical history,surgical history, family history and social history were all reviewed and documented in the EPIC chart.  Gynecologic History Patient's last menstrual period was 07/07/2019.  Obstetric History OB History  Gravida Para Term Preterm AB Living  3 3       3   SAB TAB Ectopic Multiple Live Births               # Outcome Date GA Lbr Len/2nd Weight Sex Delivery Anes PTL Lv  3 Para           2 Para           1 Para              ROS: A ROS was performed and pertinent positives and negatives are included in the history.  GENERAL: No fevers or chills. HEENT: No change in vision, no earache, sore throat or sinus congestion. NECK: No pain or stiffness. CARDIOVASCULAR: No chest pain or pressure. No palpitations. PULMONARY: No shortness of breath, cough or wheeze. GASTROINTESTINAL: No abdominal pain, nausea, vomiting or diarrhea, melena or bright red blood per rectum. GENITOURINARY: No urinary frequency, urgency, hesitancy or dysuria. MUSCULOSKELETAL: No joint or muscle pain, no back pain, no recent trauma. DERMATOLOGIC: No rash, no itching, no lesions. ENDOCRINE: No polyuria, polydipsia, no heat or cold intolerance. No recent change in weight. HEMATOLOGICAL: No anemia or easy bruising or bleeding. NEUROLOGIC: No headache, seizures, numbness, tingling or weakness. PSYCHIATRIC: No depression, no loss of interest in normal activity or change in sleep pattern.     Exam:   BP 108/70   Ht 5' 1.25" (1.556 m)   Wt 117  lb (53.1 kg)   LMP 07/07/2019 Comment: PTS HUSBAND WITH VASECTOMY  BMI 21.93 kg/m   Body mass index is 21.93 kg/m.  General appearance : Well developed well nourished female. No acute distress HEENT: Eyes: no retinal hemorrhage or exudates,  Neck supple, trachea midline, no carotid bruits, no thyroidmegaly Lungs: Clear to auscultation, no rhonchi or wheezes, or rib retractions  Heart: Regular rate and rhythm, no murmurs or gallops Breast:Examined in sitting and supine position were symmetrical in appearance, no palpable masses or tenderness,  no skin retraction, no nipple inversion, no nipple discharge, no skin discoloration, no axillary or supraclavicular lymphadenopathy Abdomen: no palpable masses or tenderness, no rebound or guarding Extremities: no edema or skin discoloration or tenderness  Pelvic: Vulva: Normal             Vagina: No gross lesions or discharge  Cervix: No gross lesions or discharge.  Pap/HPV HR done.  Uterus  AV, normal size, shape and consistency, non-tender and mobile  Adnexa  Without masses or tenderness  Anus: Normal   Assessment/Plan:  44 y.o. female for annual exam   1. Encounter for routine gynecological examination with Papanicolaou smear of cervix Normal gynecologic exam.  Pap test with high-risk HPV done.  Breast exam normal.  Screening mammogram March 2021 was negative.  Health labs with family  physician.  Good body mass index at 21.93.  Continue with fitness and healthy nutrition.  2. Relies on partner vasectomy for contraception  3. Dysplasia of cervix, low grade (CIN 1) Pap test with high-risk HPV done today.  [name], 11:19 AM 07/23/2019

## 2019-07-26 LAB — PAP, TP IMAGING W/ HPV RNA, RFLX HPV TYPE 16,18/45: HPV DNA High Risk: NOT DETECTED

## 2019-07-29 ENCOUNTER — Encounter: Payer: Self-pay | Admitting: Obstetrics & Gynecology

## 2019-07-29 NOTE — Patient Instructions (Signed)
1. Encounter for routine gynecological examination with Papanicolaou smear of cervix Normal gynecologic exam.  Pap test with high-risk HPV done.  Breast exam normal.  Screening mammogram March 2021 was negative.  Health labs with family physician.  Good body mass index at 21.93.  Continue with fitness and healthy nutrition.  2. Relies on partner vasectomy for contraception  3. Dysplasia of cervix, low grade (CIN 1) Pap test with high-risk HPV done today.  Laura Wells, it was a pleasure seeing you today!  I will inform you of your results as soon as they are available.

## 2019-10-10 DIAGNOSIS — Z20822 Contact with and (suspected) exposure to covid-19: Secondary | ICD-10-CM | POA: Diagnosis not present

## 2019-12-18 DIAGNOSIS — E559 Vitamin D deficiency, unspecified: Secondary | ICD-10-CM | POA: Diagnosis not present

## 2019-12-18 DIAGNOSIS — R5383 Other fatigue: Secondary | ICD-10-CM | POA: Diagnosis not present

## 2019-12-18 DIAGNOSIS — Z8632 Personal history of gestational diabetes: Secondary | ICD-10-CM | POA: Diagnosis not present

## 2019-12-20 DIAGNOSIS — H5213 Myopia, bilateral: Secondary | ICD-10-CM | POA: Diagnosis not present

## 2020-01-08 ENCOUNTER — Ambulatory Visit: Payer: BC Managed Care – PPO | Admitting: Nurse Practitioner

## 2020-03-09 DIAGNOSIS — Z20822 Contact with and (suspected) exposure to covid-19: Secondary | ICD-10-CM | POA: Diagnosis not present

## 2020-03-11 IMAGING — MG DIGITAL DIAGNOSTIC BILAT W/ TOMO W/ CAD
8 series · 8 of 24 positions shown · non-contrast
Comparison: Previous exam(s).

CLINICAL DATA: 44-year-old female with diffuse bilateral breast
pain which has now resolved. Also for annual bilateral mammogram.

EXAM:
DIGITAL DIAGNOSTIC BILATERAL MAMMOGRAM WITH CAD AND TOMO

[L MLO synth-2D]
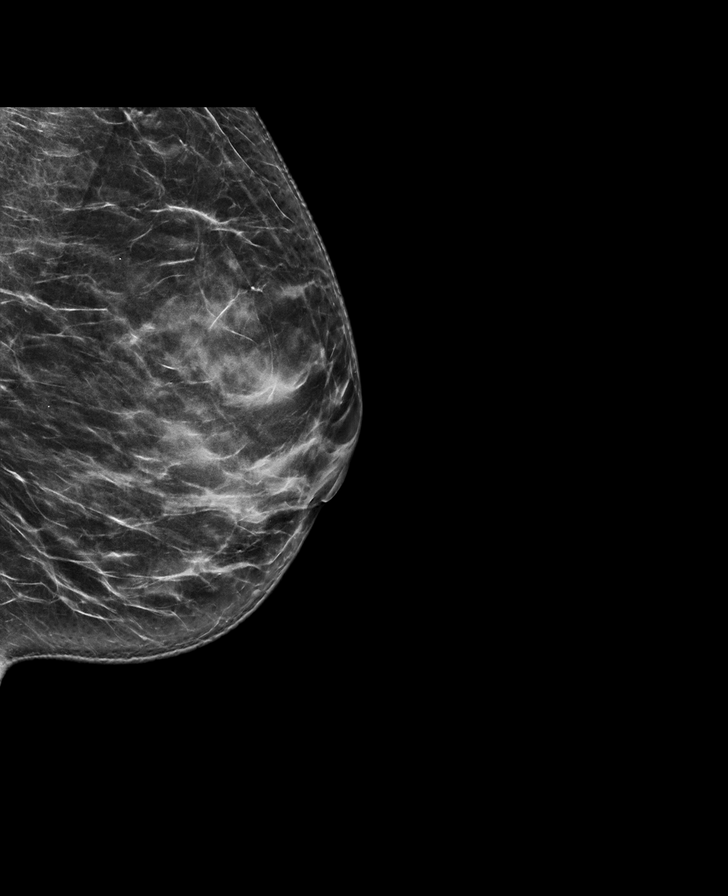

[R MLO synth-2D]
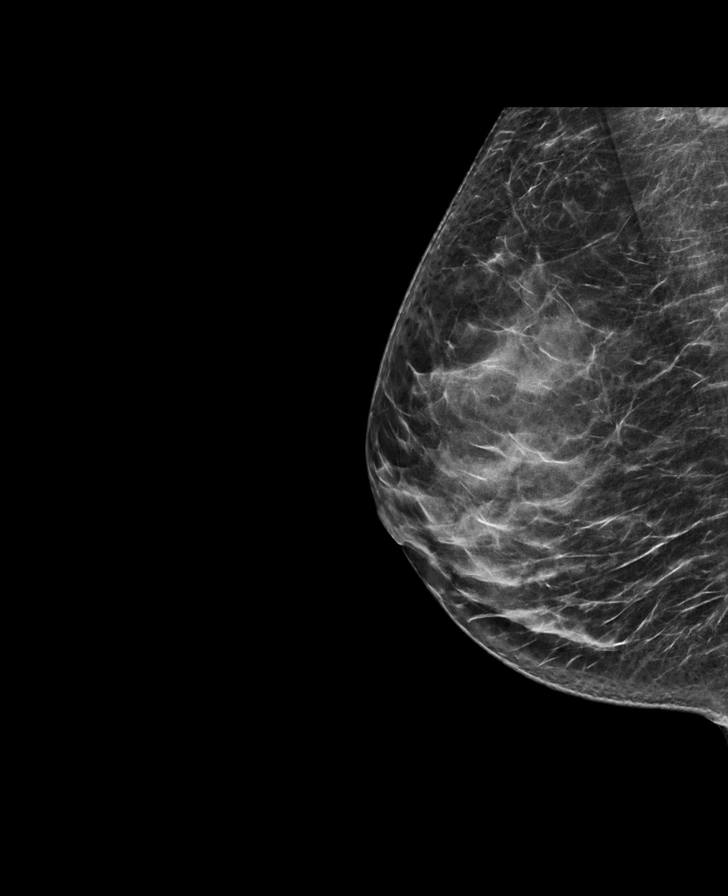

[L CC synth-2D]
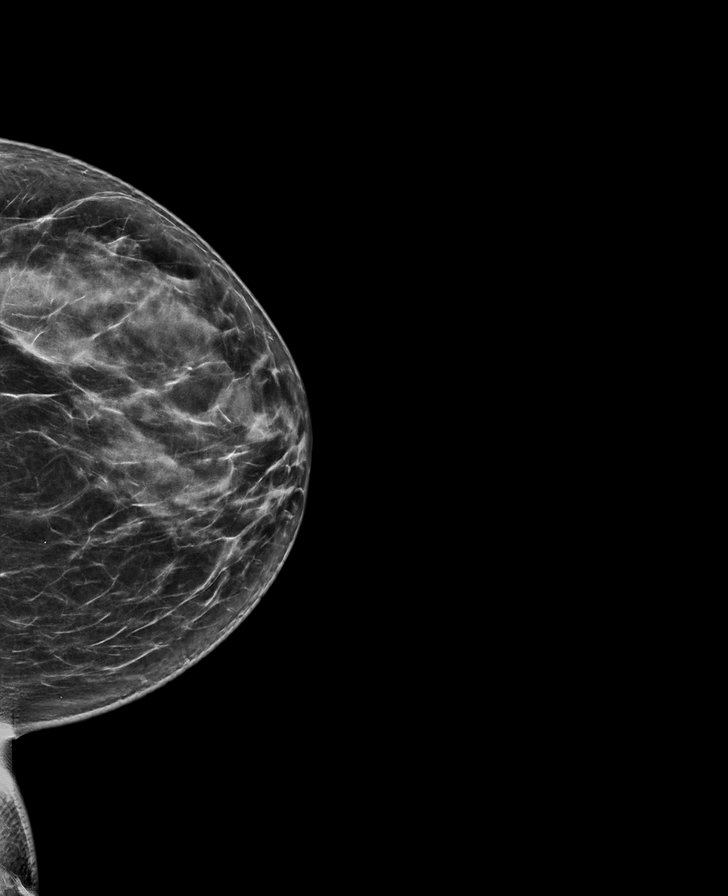

[R CC synth-2D]
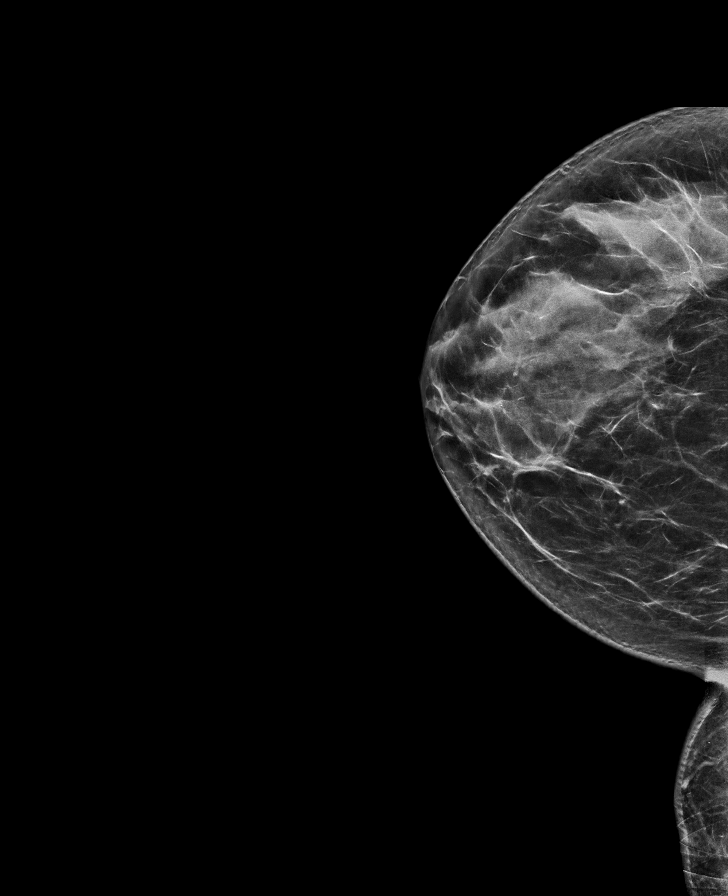

[R CC tomo · tomo slice 38/75.0]
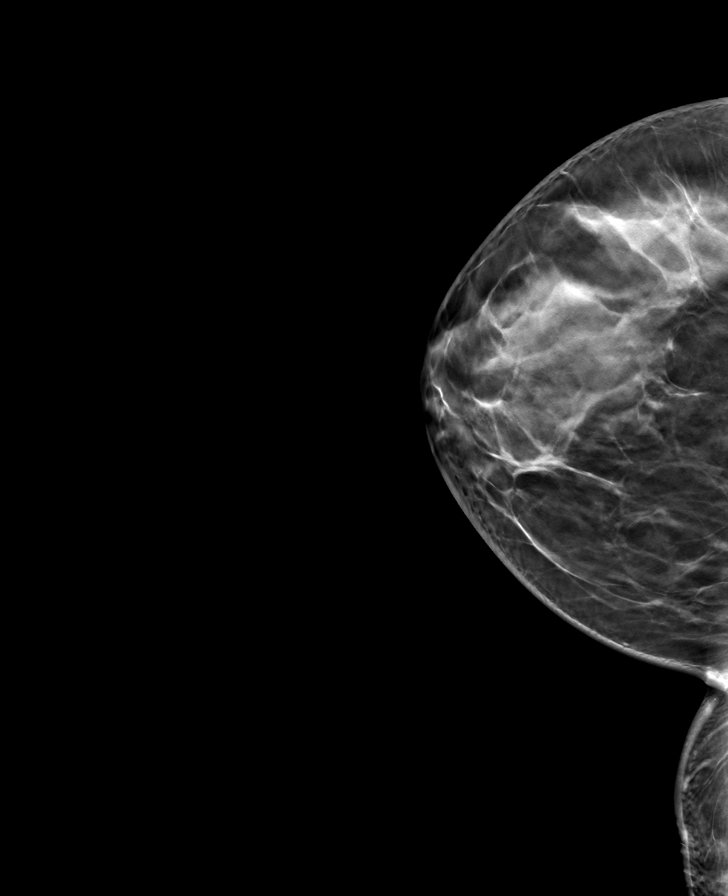

[R MLO tomo · tomo slice 36/71.0]
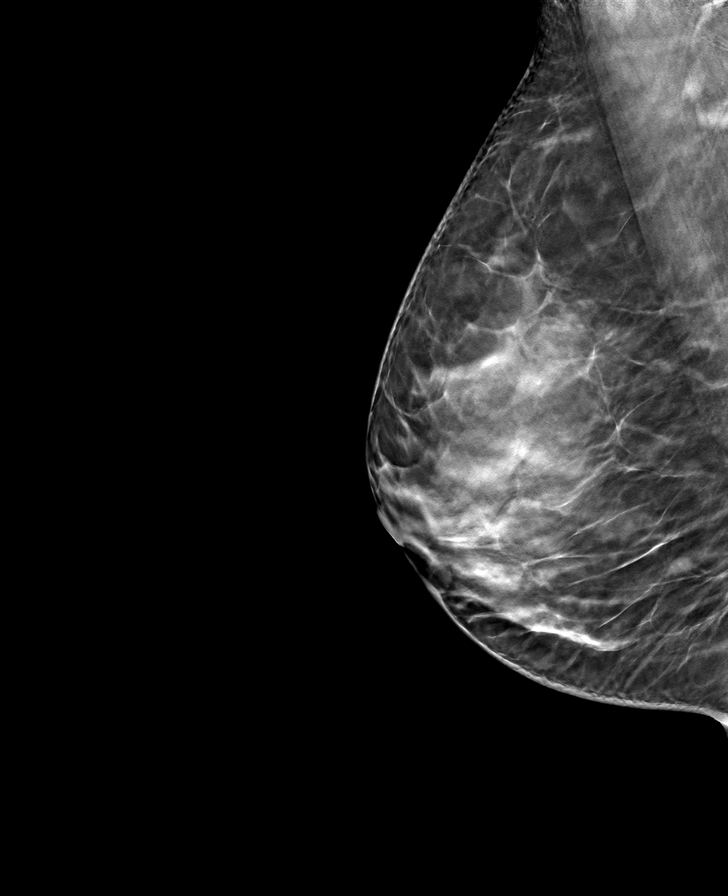

[L MLO tomo · tomo slice 38/75.0]
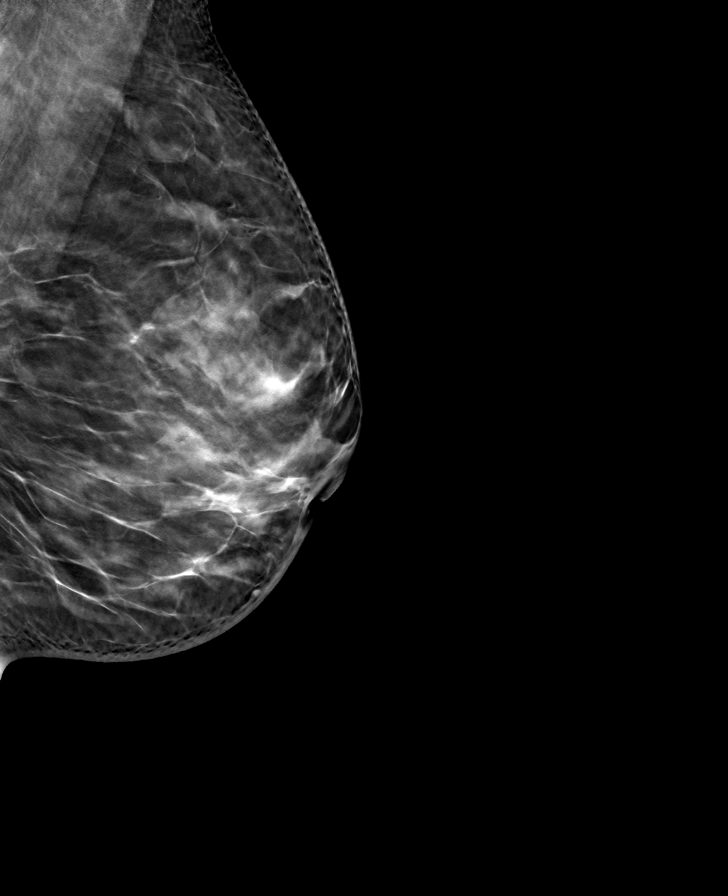

[L CC tomo · tomo slice 38/75.0]
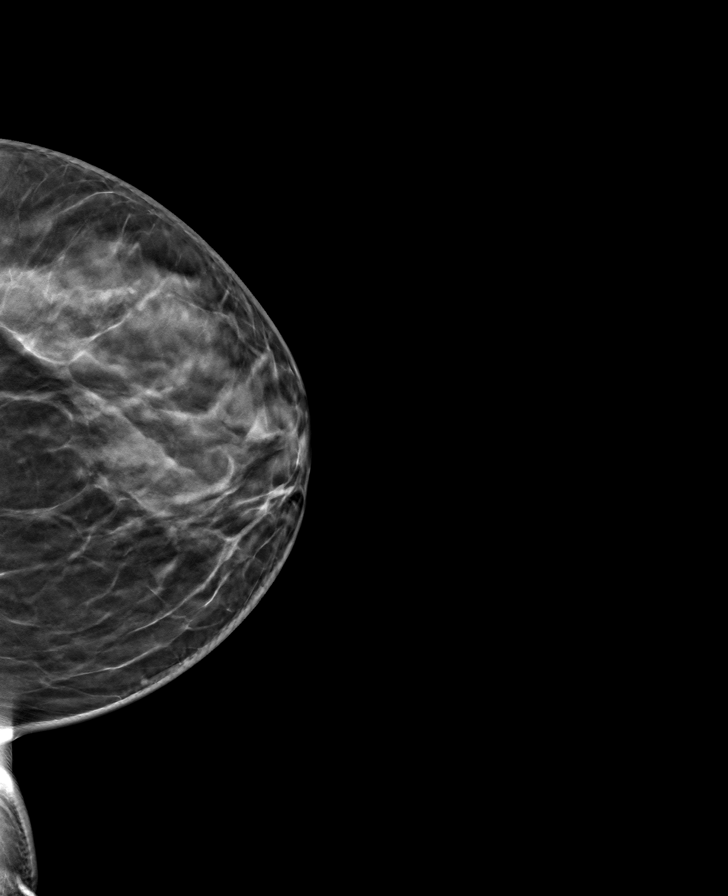

[8 of 24 positions shown; findings below may reference images not displayed]

ACR Breast Density Category c: The breast tissue is heterogeneously
dense, which may obscure small masses.
FINDINGS: 2D/3D full field views of both breasts demonstrate no suspicious
mass, distortion or worrisome calcifications.

Mammographic images were processed with CAD.
IMPRESSION: No mammographic evidence of breast malignancy.

RECOMMENDATION:
Bilateral screening mammogram in 1 year.

I have discussed the findings, causes of breast pain and
recommendations with the patient. If applicable, a reminder letter
will be sent to the patient regarding the next appointment.

BI-RADS CATEGORY  1: Negative.

## 2020-04-01 DIAGNOSIS — Z03818 Encounter for observation for suspected exposure to other biological agents ruled out: Secondary | ICD-10-CM | POA: Diagnosis not present

## 2020-04-01 DIAGNOSIS — R059 Cough, unspecified: Secondary | ICD-10-CM | POA: Diagnosis not present

## 2020-04-02 DIAGNOSIS — R059 Cough, unspecified: Secondary | ICD-10-CM | POA: Diagnosis not present

## 2020-04-09 ENCOUNTER — Other Ambulatory Visit: Payer: Self-pay

## 2020-04-10 ENCOUNTER — Ambulatory Visit (INDEPENDENT_AMBULATORY_CARE_PROVIDER_SITE_OTHER): Payer: BC Managed Care – PPO | Admitting: Family Medicine

## 2020-04-10 ENCOUNTER — Encounter: Payer: Self-pay | Admitting: Family Medicine

## 2020-04-10 VITALS — BP 116/64 | HR 86 | Temp 97.7°F | Ht 61.0 in | Wt 114.2 lb

## 2020-04-10 DIAGNOSIS — Z13220 Encounter for screening for lipoid disorders: Secondary | ICD-10-CM | POA: Diagnosis not present

## 2020-04-10 DIAGNOSIS — Z7689 Persons encountering health services in other specified circumstances: Secondary | ICD-10-CM | POA: Diagnosis not present

## 2020-04-10 DIAGNOSIS — Z0000 Encounter for general adult medical examination without abnormal findings: Secondary | ICD-10-CM | POA: Diagnosis not present

## 2020-04-10 DIAGNOSIS — Z23 Encounter for immunization: Secondary | ICD-10-CM

## 2020-04-10 DIAGNOSIS — Z1231 Encounter for screening mammogram for malignant neoplasm of breast: Secondary | ICD-10-CM

## 2020-04-10 DIAGNOSIS — R058 Other specified cough: Secondary | ICD-10-CM

## 2020-04-10 DIAGNOSIS — Z1321 Encounter for screening for nutritional disorder: Secondary | ICD-10-CM | POA: Diagnosis not present

## 2020-04-10 LAB — CBC
HCT: 41.1 % (ref 36.0–46.0)
Hemoglobin: 13.6 g/dL (ref 12.0–15.0)
MCHC: 33 g/dL (ref 30.0–36.0)
MCV: 81.8 fl (ref 78.0–100.0)
Platelets: 268 10*3/uL (ref 150.0–400.0)
RBC: 5.03 Mil/uL (ref 3.87–5.11)
RDW: 13 % (ref 11.5–15.5)
WBC: 4.7 10*3/uL (ref 4.0–10.5)

## 2020-04-10 LAB — BASIC METABOLIC PANEL
BUN: 12 mg/dL (ref 6–23)
CO2: 29 mEq/L (ref 19–32)
Calcium: 9.5 mg/dL (ref 8.4–10.5)
Chloride: 103 mEq/L (ref 96–112)
Creatinine, Ser: 0.8 mg/dL (ref 0.40–1.20)
GFR: 88.05 mL/min (ref >60.00)
Glucose, Bld: 97 mg/dL (ref 70–99)
Potassium: 4 mEq/L (ref 3.5–5.1)
Sodium: 138 mEq/L (ref 135–145)

## 2020-04-10 LAB — VITAMIN D 25 HYDROXY (VIT D DEFICIENCY, FRACTURES): VITD: 23.98 ng/mL — ABNORMAL LOW (ref 30.00–100.00)

## 2020-04-10 LAB — LIPID PANEL
Cholesterol: 264 mg/dL — ABNORMAL HIGH (ref 0–200)
HDL: 54.7 mg/dL (ref >39.00)
NonHDL: 208.98
Total CHOL/HDL Ratio: 5
Triglycerides: 220 mg/dL — ABNORMAL HIGH (ref 0.0–149.0)
VLDL: 44 mg/dL — ABNORMAL HIGH (ref 0.0–40.0)

## 2020-04-10 LAB — LDL CHOLESTEROL, DIRECT: Direct LDL: 188 mg/dL

## 2020-04-10 LAB — ALT: ALT: 32 U/L (ref 0–35)

## 2020-04-10 LAB — AST: AST: 23 U/L (ref 0–37)

## 2020-04-10 NOTE — Addendum Note (Signed)
Addended by: Waymond Cera on: 04/10/2020 12:02 PM   Modules accepted: Orders

## 2020-04-10 NOTE — Progress Notes (Addendum)
Laura Wells is a 45 y.o. female  Chief Complaint  Patient presents with  . Establish Care    NP- CPE/labs.  C/o having a re-occurring cough every year that started in November.  Has been taking Singular for this.   Declines flu shot today.    HPI: Laura Wells is a 45 y.o. female seen today as a new patient for CPE, labs. She was previously with [facility].  She will get flu vaccine today.  Last PAP: follows with GYN - last PAP 1-2 years ago and normal Last mammo: unsure of last one Last colonoscopy: never - declines at this time  Diet/Exercise: diet is overall healthy - more protein, less carbs; no regular exercise Dental: UTD Vision: wears glasses for distance and UTD exam  She complains of chronic cough that started about 5-6 years ago and always occurs in late fall/early winter. She was Rx'd on singulair most days in Nov and takes zyrtec PRN. + PND. + cough - more so at night. No runny nose, nasal congestion.  History reviewed. No pertinent past medical history.  Past Surgical History:  Procedure Laterality Date  . NOSE SURGERY      Social History   Socioeconomic History  . Marital status: Married    Spouse name: Not on file  . Number of children: Not on file  . Years of education: Not on file  . Highest education level: Not on file  Occupational History  . Not on file  Tobacco Use  . Smoking status: Never Smoker  . Smokeless tobacco: Never Used  Vaping Use  . Vaping Use: Never used  Substance and Sexual Activity  . Alcohol use: No  . Drug use: No  . Sexual activity: Yes    Partners: Male    Birth control/protection: None    Comment: 1st intercourse- 20's, married- 18 yrs   Other Topics Concern  . Not on file  Social History Narrative  . Not on file   Social Determinants of Health   Financial Resource Strain: Not on file  Food Insecurity: Not on file  Transportation Needs: Not on file  Physical Activity: Not on file  Stress: Not on  file  Social Connections: Not on file  Intimate Partner Violence: Not on file    Family History  Problem Relation Age of Onset  . Diabetes Father   . Thyroid disease Father   . Hypertension Father   . Thyroid disease Daughter   . Allergies Son      Immunization History  Administered Date(s) Administered  . PFIZER(Purple Top)SARS-COV-2 Vaccination 06/07/2019, 06/28/2019  . Tdap 01/09/2012    Outpatient Encounter Medications as of 04/10/2020  Medication Sig  . benzonatate (TESSALON) 200 MG capsule 1 capsule as needed for cough (best for day)  . cetirizine (ZYRTEC) 10 MG tablet Take 10 mg by mouth daily.  . promethazine-dextromethorphan (PROMETHAZINE-DM) 6.25-15 MG/5ML syrup 5 ml as needed for cough (best for night)  . VITAMIN D PO Take by mouth.   No facility-administered encounter medications on file as of 04/10/2020.     ROS: Gen: no fever, chills  Skin: no rash, itching ENT: see HPI Eyes: no blurry vision, double vision Resp: no cough, wheeze,SOB CV: no CP, palpitations, LE edema,  GI: no heartburn, n/v/d/c, abd pain GU: no dysuria, urgency, frequency, hematuria  MSK: no joint pain, myalgias, back pain Neuro: no dizziness, headache, weakness Psych: no depression, anxiety, insomnia   No Known Allergies  BP 116/64  Pulse 86   Temp 97.7 F (36.5 C) (Temporal)   Ht 5\' 1"  (1.549 m)   Wt 114 lb 3.2 oz (51.8 kg)   SpO2 98%   BMI 21.58 kg/m   Physical Exam Constitutional:      General: She is not in acute distress.    Appearance: She is well-developed and well-nourished.  HENT:     Head: Normocephalic and atraumatic.     Right Ear: Tympanic membrane and ear canal normal.     Left Ear: Tympanic membrane and ear canal normal.     Nose: Nose normal.     Mouth/Throat:     Mouth: Oropharynx is clear and moist and mucous membranes are normal.  Eyes:     Conjunctiva/sclera: Conjunctivae normal.     Pupils: Pupils are equal, round, and reactive to light.  Neck:      Thyroid: No thyromegaly.  Cardiovascular:     Rate and Rhythm: Normal rate and regular rhythm.     Pulses: Intact distal pulses.     Heart sounds: Normal heart sounds. No murmur heard.   Pulmonary:     Effort: Pulmonary effort is normal. No respiratory distress.     Breath sounds: Normal breath sounds. No wheezing or rhonchi.  Abdominal:     General: Bowel sounds are normal. There is no distension.     Palpations: Abdomen is soft. There is no mass.     Tenderness: There is no abdominal tenderness.  Musculoskeletal:        General: No edema.     Cervical back: Neck supple.     Right lower leg: No edema.     Left lower leg: No edema.  Lymphadenopathy:     Cervical: No cervical adenopathy.  Skin:    General: Skin is warm and dry.  Neurological:     Mental Status: She is alert and oriented to person, place, and time.     Motor: No abnormal muscle tone.     Coordination: Coordination normal.  Psychiatric:        Mood and Affect: Mood and affect normal.        Behavior: Behavior normal.      A/P:  1. Encounter to establish care with new doctor  2. Annual physical exam - discussed importance of regular CV exercise, healthy diet, adequate sleep - UTD on PAP; due for mammo and colonoscopy - UTD on dental and vision exams - ALT - AST - Basic metabolic panel - CBC - Lipid panel - next CPE in 1 year  3. Screening for lipid disorders - Lipid panel  4. Encounter for vitamin deficiency screening - VITAMIN D 25 Hydroxy (Vit-D Deficiency, Fractures)  5. Encounter for screening mammogram for malignant neoplasm of breast - MM DIGITAL SCREENING BILATERAL; Future  6. Allergic cough - occurs annually around Nov - pt was started on Singulair by previous PCP but is not taking regularly and is not taking zyrtec regularly - restart zyrtec, add flonase, add nasal saline spray - if no/minimal improvement, then restart singulair  This visit occurred during the SARS-CoV-2 public  health emergency.  Safety protocols were in place, including screening questions prior to the visit, additional usage of staff PPE, and extensive cleaning of exam room while observing appropriate contact time as indicated for disinfecting solutions.

## 2020-04-10 NOTE — Patient Instructions (Addendum)
mammo referral placed Due for colonoscopy   I would recommend: Take zyrtec or claritin 1 tab daily Use flonase 2 sprays each nostril daily Use nasal saline spray 2-3x/day and then blow nose If no/minimal improvement in 2-3 wks, then restart singulair  Health Maintenance, Female Adopting a healthy lifestyle and getting preventive care are important in promoting health and wellness. Ask your health care provider about:  The right schedule for you to have regular tests and exams.  Things you can do on your own to prevent diseases and keep yourself healthy. What should I know about diet, weight, and exercise? Eat a healthy diet  Eat a diet that includes plenty of vegetables, fruits, low-fat dairy products, and lean protein.  Do not eat a lot of foods that are high in solid fats, added sugars, or sodium.   Maintain a healthy weight Body mass index (BMI) is used to identify weight problems. It estimates body fat based on height and weight. Your health care provider can help determine your BMI and help you achieve or maintain a healthy weight. Get regular exercise Get regular exercise. This is one of the most important things you can do for your health. Most adults should:  Exercise for at least 150 minutes each week. The exercise should increase your heart rate and make you sweat (moderate-intensity exercise).  Do strengthening exercises at least twice a week. This is in addition to the moderate-intensity exercise.  Spend less time sitting. Even light physical activity can be beneficial. Watch cholesterol and blood lipids Have your blood tested for lipids and cholesterol at 47 years of age, then have this test every 5 years. Have your cholesterol levels checked more often if:  Your lipid or cholesterol levels are high.  You are older than 47 years of age.  You are at high risk for heart disease. What should I know about cancer screening? Depending on your health history and family  history, you may need to have cancer screening at various ages. This may include screening for:  Breast cancer.  Cervical cancer.  Colorectal cancer.  Skin cancer.  Lung cancer. What should I know about heart disease, diabetes, and high blood pressure? Blood pressure and heart disease  High blood pressure causes heart disease and increases the risk of stroke. This is more likely to develop in people who have high blood pressure readings, are of African descent, or are overweight.  Have your blood pressure checked: ? Every 3-5 years if you are 41-85 years of age. ? Every year if you are 11 years old or older. Diabetes Have regular diabetes screenings. This checks your fasting blood sugar level. Have the screening done:  Once every three years after age 47 if you are at a normal weight and have a low risk for diabetes.  More often and at a younger age if you are overweight or have a high risk for diabetes. What should I know about preventing infection? Hepatitis B If you have a higher risk for hepatitis B, you should be screened for this virus. Talk with your health care provider to find out if you are at risk for hepatitis B infection. Hepatitis C Testing is recommended for:  Everyone born from [birth year] through 1967.  Anyone with known risk factors for hepatitis C. Sexually transmitted infections (STIs)  Get screened for STIs, including gonorrhea and chlamydia, if: ? You are sexually active and are younger than 46 years of age. ? You are older than 47 years of  age and your health care provider tells you that you are at risk for this type of infection. ? Your sexual activity has changed since you were last screened, and you are at increased risk for chlamydia or gonorrhea. Ask your health care provider if you are at risk.  Ask your health care provider about whether you are at high risk for HIV. Your health care provider may recommend a prescription medicine to help prevent HIV  infection. If you choose to take medicine to prevent HIV, you should first get tested for HIV. You should then be tested every 3 months for as long as you are taking the medicine. Pregnancy  If you are about to stop having your period (premenopausal) and you may become pregnant, seek counseling before you get pregnant.  Take 400 to 800 micrograms (mcg) of folic acid every day if you become pregnant.  Ask for birth control (contraception) if you want to prevent pregnancy. Osteoporosis and menopause Osteoporosis is a disease in which the bones lose minerals and strength with aging. This can result in bone fractures. If you are 65 years old or older, or if you are at risk for osteoporosis and fractures, ask your health care provider if you should:  Be screened for bone loss.  Take a calcium or vitamin D supplement to lower your risk of fractures.  Be given hormone replacement therapy (HRT) to treat symptoms of menopause. Follow these instructions at home: Lifestyle  Do not use any products that contain nicotine or tobacco, such as cigarettes, e-cigarettes, and chewing tobacco. If you need help quitting, ask your health care provider.  Do not use street drugs.  Do not share needles.  Ask your health care provider for help if you need support or information about quitting drugs. Alcohol use  Do not drink alcohol if: ? Your health care provider tells you not to drink. ? You are pregnant, may be pregnant, or are planning to become pregnant.  If you drink alcohol: ? Limit how much you use to 0-1 drink a day. ? Limit intake if you are breastfeeding.  Be aware of how much alcohol is in your drink. In the [place] one drink equals one 12 oz bottle of beer (355 mL), one 5 oz glass of wine (148 mL), or one 1 oz glass of hard liquor (44 mL). General instructions  Schedule regular health, dental, and eye exams.  Stay current with your vaccines.  Tell your health care provider if: ? You  often feel depressed. ? You have ever been abused or do not feel safe at home. Summary  Adopting a healthy lifestyle and getting preventive care are important in promoting health and wellness.  Follow your health care provider's instructions about healthy diet, exercising, and getting tested or screened for diseases.  Follow your health care provider's instructions on monitoring your cholesterol and blood pressure. This information is not intended to replace advice given to you by your health care provider. Make sure you discuss any questions you have with your health care provider. Document Revised: 03/01/2018 Document Reviewed: 03/01/2018 Elsevier Patient Education  2021 [employer] Inc.  

## 2020-05-07 ENCOUNTER — Encounter: Payer: Self-pay | Admitting: Family Medicine

## 2020-05-07 ENCOUNTER — Other Ambulatory Visit: Payer: Self-pay | Admitting: Family Medicine

## 2020-05-07 DIAGNOSIS — E559 Vitamin D deficiency, unspecified: Secondary | ICD-10-CM | POA: Insufficient documentation

## 2020-05-07 DIAGNOSIS — E782 Mixed hyperlipidemia: Secondary | ICD-10-CM

## 2020-05-07 MED ORDER — VITAMIN D (ERGOCALCIFEROL) 1.25 MG (50000 UNIT) PO CAPS: 50000.0000 [IU] | ORAL_CAPSULE | ORAL | 2 refills | Status: DC

## 2020-05-22 ENCOUNTER — Other Ambulatory Visit: Payer: Self-pay

## 2020-05-22 ENCOUNTER — Encounter: Payer: Self-pay | Admitting: Nurse Practitioner

## 2020-05-22 ENCOUNTER — Ambulatory Visit: Payer: BC Managed Care – PPO | Admitting: Nurse Practitioner

## 2020-05-22 VITALS — BP 110/80

## 2020-05-22 DIAGNOSIS — N926 Irregular menstruation, unspecified: Secondary | ICD-10-CM

## 2020-05-22 DIAGNOSIS — N898 Other specified noninflammatory disorders of vagina: Secondary | ICD-10-CM | POA: Diagnosis not present

## 2020-05-22 LAB — WET PREP FOR TRICH, YEAST, CLUE

## 2020-05-22 NOTE — Progress Notes (Signed)
   Acute Office Visit  Subjective:    Patient ID: Laura Wells, female    DOB: Feb 23, 1976, 45 y.o.   MRN: [id number]   HPI 45 y.o. presents today for vaginal bumps, vaginal discharge, and missed cycle. She noticed bumps about a month ago and denies redness, swelling, or pain. Her husband has HSV so she is worried she has contacted it. She missed a cycle in February for the first time. Denies menopausal symptoms. Husband has vasectomy. Complains of vaginal discharge without itching. She used Monistat for 3 days with last dose 2 days ago.   Review of Systems  Constitutional: Negative.   Genitourinary: Positive for menstrual problem and vaginal discharge. Negative for genital sores and vaginal pain.       Objective:    Physical Exam Constitutional:      Appearance: Normal appearance.  Genitourinary:    General: Normal vulva.     Vagina: Normal.     Cervix: Normal.     BP 110/80 (BP Location: Right Arm, Patient Position: Sitting, Cuff Size: Normal)   LMP 03/06/2020  Wt Readings from Last 3 Encounters:  04/10/20 114 lb 3.2 oz (51.8 kg)  07/23/19 117 lb (53.1 kg)  05/25/18 116 lb (52.6 kg)   Wet prep negative     Assessment & Plan:   Problem List Items Addressed This Visit   None   Visit Diagnoses    Vaginal discharge    -  Primary   Relevant Orders   WET PREP FOR TRICH, YEAST, CLUE   Missed menses         Plan: Reassurance that no lesions are present. She agrees that the bumps she felt are no longer there. We discussed HSV symptoms, transmission, and management if exposed. Missed cycle very likely due to perimenopause. We discussed what to expect during perimenopause. All questions answered.      Olivia Mackie [facility], 4:15 PM 05/22/2020

## 2020-05-22 NOTE — Patient Instructions (Signed)
[employer] Textbook [employer]ocrinology (14th ed., pp. 574-641). [place], [place]: Elsevier.">  Perimenopause Perimenopause is the normal time of a woman's life when the levels of estrogen, the female hormone produced by the ovaries, begin to decrease. This leads to changes in menstrual periods before they stop completely (menopause). Perimenopause can begin 2-8 years before menopause. During perimenopause, the ovaries may or may not produce an egg and a woman can still become pregnant. What are the causes? This condition is caused by a natural change in hormone levels that happens as you get older. What increases the risk? This condition is more likely to start at an earlier age if you have certain medical conditions or have undergone treatments, including:  A tumor of the pituitary gland in the brain.  A disease that affects the ovaries and hormone production.  Certain cancer treatments, such as chemotherapy or hormone therapy, or radiation therapy on the pelvis.  Heavy smoking and excessive alcohol use.  Family history of early menopause. What are the signs or symptoms? Perimenopausal changes affect each woman differently. Symptoms of this condition may include:  Hot flashes.  Irregular menstrual periods.  Night sweats.  Changes in feelings about sex. This could be a decrease in sex drive or an increased discomfort around your sexuality.  Vaginal dryness.  Headaches.  Mood swings.  Depression.  Problems sleeping (insomnia).  Memory problems or trouble concentrating.  Irritability.  Tiredness.  Weight gain.  Anxiety.  Trouble getting pregnant. How is this diagnosed? This condition is diagnosed based on your medical history, a physical exam, your age, your menstrual history, and your symptoms. Hormone tests may also be done. How is this treated? In some cases, no treatment is needed. You and your health care provider should make a decision together about whether  treatment is necessary. Treatment will be based on your individual condition and preferences. Various treatments are available, such as:  Menopausal hormone therapy (MHT).  Medicines to treat specific symptoms.  Acupuncture.  Vitamin or herbal supplements. Before starting treatment, make sure to let your health care provider know if you have a personal or family history of:  Heart disease.  Breast cancer.  Blood clots.  Diabetes.  Osteoporosis. Follow these instructions at home: Medicines  Take over-the-counter and prescription medicines only as told by your health care provider.  Take vitamin supplements only as told by your health care provider.  Talk with your health care provider before starting any herbal supplements. Lifestyle  Do not use any products that contain nicotine or tobacco, such as cigarettes, e-cigarettes, and chewing tobacco. If you need help quitting, ask your health care provider.  Get at least 30 minutes of physical activity on 5 or more days each week.  Eat a balanced diet that includes fresh fruits and vegetables, whole grains, soybeans, eggs, lean meat, and low-fat dairy.  Avoid alcoholic and caffeinated beverages, as well as spicy foods. This may help prevent hot flashes.  Get 7-8 hours of sleep each night.  Dress in layers that can be removed to help you manage hot flashes.  Find ways to manage stress, such as deep breathing, meditation, or journaling.   General instructions  Keep track of your menstrual periods, including: ? When they occur. ? How heavy they are and how long they last. ? How much time passes between periods.  Keep track of your symptoms, noting when they start, how often you have them, and how long they last.  Use vaginal lubricants or moisturizers to help   with vaginal dryness and improve comfort during sex.  You can still become pregnant if you are having irregular periods. Make sure you use contraception during  perimenopause if you do not want to get pregnant.  Keep all follow-up visits. This is important. This includes any group therapy or counseling.   Contact a health care provider if:  You have heavy vaginal bleeding or pass blood clots.  Your period lasts more than 2 days longer than normal.  Your periods are recurring sooner than 21 days.  You bleed after having sex.  You have pain during sex. Get help right away if you have:  Chest pain, trouble breathing, or trouble talking.  Severe depression.  Pain when you urinate.  Severe headaches.  Vision problems. Summary  Perimenopause is the time when a woman's body begins to move into menopause. This may happen naturally or as a result of other health problems or medical treatments.  Perimenopause can begin 2-8 years before menopause, and it can last for several years.  Perimenopausal symptoms can be managed through medicines, lifestyle changes, and complementary therapies such as acupuncture. This information is not intended to replace advice given to you by your health care provider. Make sure you discuss any questions you have with your health care provider. Document Revised: 08/23/2019 Document Reviewed: 08/23/2019 Elsevier Patient Education  2021 Elsevier Inc.  

## 2020-07-30 ENCOUNTER — Other Ambulatory Visit: Payer: Self-pay

## 2020-07-30 ENCOUNTER — Encounter: Payer: Self-pay | Admitting: Obstetrics & Gynecology

## 2020-07-30 ENCOUNTER — Other Ambulatory Visit (HOSPITAL_COMMUNITY)
Admission: RE | Admit: 2020-07-30 | Discharge: 2020-07-30 | Disposition: A | Discharge status: Home / Self Care | Payer: BC Managed Care – PPO | Source: Ambulatory Visit | Attending: Obstetrics & Gynecology | Admitting: Obstetrics & Gynecology

## 2020-07-30 ENCOUNTER — Ambulatory Visit (INDEPENDENT_AMBULATORY_CARE_PROVIDER_SITE_OTHER): Payer: BC Managed Care – PPO | Admitting: Obstetrics & Gynecology

## 2020-07-30 VITALS — BP 116/74 | Ht 61.0 in | Wt 117.0 lb

## 2020-07-30 DIAGNOSIS — Z9189 Other specified personal risk factors, not elsewhere classified: Secondary | ICD-10-CM

## 2020-07-30 DIAGNOSIS — N870 Mild cervical dysplasia: Secondary | ICD-10-CM

## 2020-07-30 DIAGNOSIS — Z01419 Encounter for gynecological examination (general) (routine) without abnormal findings: Secondary | ICD-10-CM | POA: Insufficient documentation

## 2020-07-30 NOTE — Progress Notes (Signed)
Laura Wells 1975-12-06 [id number]   History:    45 y.o. G3P3L3 Married. Vasectomy. Children 16-19-21  EH:[id number] presenting for annual gyn exam   DXA:JOINOM menstrual periods with light flow every 3- 4 wks.  I episode of 8 weeks apart. No breakthrough bleeding. No pelvic pain. No pain with intercourse. Had Yeast Vaginitis, better with Boric Acid.  Breast normal. Body mass index 22.11. Good nutrition.Planning to get back into better fitness. Health labs with family physician.  Past medical history,surgical history, family history and social history were all reviewed and documented in the EPIC chart.  Gynecologic History No LMP recorded.  Obstetric History OB History  Gravida Para Term Preterm AB Living  3 3       3   SAB IAB Ectopic Multiple Live Births               # Outcome Date GA Lbr Len/2nd Weight Sex Delivery Anes PTL Lv  3 Para           2 Para           1 Para              ROS: A ROS was performed and pertinent positives and negatives are included in the history.  GENERAL: No fevers or chills. HEENT: No change in vision, no earache, sore throat or sinus congestion. NECK: No pain or stiffness. CARDIOVASCULAR: No chest pain or pressure. No palpitations. PULMONARY: No shortness of breath, cough or wheeze. GASTROINTESTINAL: No abdominal pain, nausea, vomiting or diarrhea, melena or bright red blood per rectum. GENITOURINARY: No urinary frequency, urgency, hesitancy or dysuria. MUSCULOSKELETAL: No joint or muscle pain, no back pain, no recent trauma. DERMATOLOGIC: No rash, no itching, no lesions. ENDOCRINE: No polyuria, polydipsia, no heat or cold intolerance. No recent change in weight. HEMATOLOGICAL: No anemia or easy bruising or bleeding. NEUROLOGIC: No headache, seizures, numbness, tingling or weakness. PSYCHIATRIC: No depression, no loss of interest in normal activity or change in sleep pattern.     Exam:  07/30/20 3:06 PM      BP  116/74    Weight  117lb(53.1kg)   Height  5'1"(1.574m)   Other Vitals  BMI 22.11 kg/m2  BSA 1.51 m2  LMP 07/12/2020      General appearance : Well developed well nourished female. No acute distress HEENT: Eyes: no retinal hemorrhage or exudates,  Neck supple, trachea midline, no carotid bruits, no thyroidmegaly Lungs: Clear to auscultation, no rhonchi or wheezes, or rib retractions  Heart: Regular rate and rhythm, no murmurs or gallops Breast:Examined in sitting and supine position were symmetrical in appearance, no palpable masses or tenderness,  no skin retraction, no nipple inversion, no nipple discharge, no skin discoloration, no axillary or supraclavicular lymphadenopathy Abdomen: no palpable masses or tenderness, no rebound or guarding Extremities: no edema or skin discoloration or tenderness  Pelvic: Vulva: Normal             Vagina: No gross lesions or discharge  Cervix: No gross lesions or discharge.  Pap reflex done.  Uterus  AV, normal size, shape and consistency, non-tender and mobile  Adnexa  Without masses or tenderness  Anus: Normal   Assessment/Plan:  45 y.o. female for annual exam   1. Encounter for routine gynecological examination with Papanicolaou smear of cervix Normal gynecologic exam.  Pap reflex done.  Breast exam normal.  Will schedule a screening mammogram.  Good body mass index at 22.11.  Planning to get back into better  fitness.  Continue with healthy nutrition.  Health labs with family physician. - Cytology - PAP( [place])  2. Dysplasia of cervix, low grade (CIN 1) Pap reflex done.  3. Relies on partner vasectomy for contraception  [name], 3:01 PM 07/30/2020

## 2020-08-01 LAB — CYTOLOGY - PAP: Diagnosis: NEGATIVE

## 2020-10-15 ENCOUNTER — Other Ambulatory Visit: Payer: Self-pay | Admitting: Obstetrics & Gynecology

## 2020-10-15 ENCOUNTER — Other Ambulatory Visit: Payer: Self-pay

## 2020-10-15 ENCOUNTER — Ambulatory Visit
Admission: RE | Admit: 2020-10-15 | Discharge: 2020-10-15 | Disposition: A | Discharge status: Home / Self Care | Payer: BC Managed Care – PPO | Source: Ambulatory Visit | Attending: Family Medicine | Admitting: Family Medicine

## 2020-10-15 DIAGNOSIS — Z1231 Encounter for screening mammogram for malignant neoplasm of breast: Secondary | ICD-10-CM | POA: Diagnosis not present

## 2020-12-25 ENCOUNTER — Ambulatory Visit: Payer: BC Managed Care – PPO | Admitting: Obstetrics & Gynecology

## 2021-01-01 ENCOUNTER — Ambulatory Visit: Payer: BC Managed Care – PPO | Admitting: Obstetrics and Gynecology

## 2021-01-01 ENCOUNTER — Other Ambulatory Visit: Payer: Self-pay

## 2021-01-01 ENCOUNTER — Encounter: Payer: Self-pay | Admitting: Obstetrics and Gynecology

## 2021-01-01 VITALS — BP 110/70 | HR 74

## 2021-01-01 DIAGNOSIS — B9689 Other specified bacterial agents as the cause of diseases classified elsewhere: Secondary | ICD-10-CM

## 2021-01-01 DIAGNOSIS — N760 Acute vaginitis: Secondary | ICD-10-CM

## 2021-01-01 LAB — WET PREP FOR TRICH, YEAST, CLUE

## 2021-01-01 MED ORDER — METRONIDAZOLE 500 MG PO TABS: 500.0000 mg | ORAL_TABLET | Freq: Two times a day (BID) | ORAL | 0 refills | Status: DC

## 2021-01-01 NOTE — Patient Instructions (Signed)

## 2021-01-01 NOTE — Progress Notes (Signed)
GYNECOLOGY  VISIT   HPI: 45 y.o.   Married  Asian  female   G3P3 with Patient's last menstrual period was 11/20/2020 (exact date).   here for vaginal odor x 2 weeks.  No burning.  No itching.  No discharge.   She uses Boric acid once vaginally after her period to reduce symptoms of bacterial vaginosis.  She purchases this from [employer].   No change in her routine other than new scented.  No partner change.   Last menses was a 6 week interval.   Has a history of both yeast and BV.   GYNECOLOGIC HISTORY: Patient's last menstrual period was 11/20/2020 (exact date). Contraception:  vasectomy Menopausal hormone therapy:  None Last mammogram:  10-15-20 Neg/BiRads1 Last pap smear:  07-30-20 Neg, 07-23-19 Neg:Neg HR HPV, 05-25-18 Neg:Neg HR HPV        OB History     Gravida  3   Para  3   Term      Preterm      AB      Living  3      SAB      IAB      Ectopic      Multiple      Live Births                 Patient Active Problem List   Diagnosis Date Noted   Vitamin D deficiency 05/07/2020   Mixed hyperlipidemia 05/07/2020   Bacterial vaginosis 01/23/2019    No past medical history on file.  Past Surgical History:  Procedure Laterality Date   NOSE SURGERY      Current Outpatient Medications  Medication Sig Dispense Refill   benzonatate (TESSALON) 200 MG capsule 1 capsule as needed for cough (best for day)     cetirizine (ZYRTEC) 10 MG tablet Take 10 mg by mouth daily.     montelukast (SINGULAIR) 10 MG tablet Take 10 mg by mouth daily.     No current facility-administered medications for this visit.     ALLERGIES: Patient has no known allergies.  Family History  Problem Relation Age of Onset   Diabetes Father    Thyroid disease Father    Hypertension Father    Thyroid disease Daughter    Allergies Son     Social History   Socioeconomic History   Marital status: Married    Spouse name: Not on file   Number of children: Not on file    Years of education: Not on file   Highest education level: Not on file  Occupational History   Not on file  Tobacco Use   Smoking status: Never   Smokeless tobacco: Never  Vaping Use   Vaping Use: Never used  Substance and Sexual Activity   Alcohol use: No   Drug use: No   Sexual activity: Yes    Partners: Male    Comment: 1st intercourse- 20's, married- 18 yrs -Vasectomy  Other Topics Concern   Not on file  Social History Narrative   Not on file   Social Determinants of Health   Financial Resource Strain: Not on file  Food Insecurity: Not on file  Transportation Needs: Not on file  Physical Activity: Not on file  Stress: Not on file  Social Connections: Not on file  Intimate Partner Violence: Not on file    Review of Systems  All other systems reviewed and are negative.  PHYSICAL EXAMINATION:    BP 110/70   Pulse  74   LMP 11/20/2020 (Exact Date)   SpO2 99%     General appearance: alert, cooperative and appears stated age   Pelvic: External genitalia:  no lesions              Urethra:  normal appearing urethra with no masses, tenderness or lesions              Bartholins and Skenes: normal                 Vagina:  large amount of clumpy white discharge. No lesions.               Cervix: no lesions                Bimanual Exam:  Uterus:  normal size, contour, position, consistency, mobility, non-tender              Adnexa: no mass, fullness, tenderness       Chaperone was present for exam:  Marchelle Folks, CMA.  ASSESSMENT  Vaginitis:  BV today. Hx BV and yeast.   PLAN  Wet prep;  Clue cells, negative yeast, negative trichomonas. Metrogel pv at hs x 5 nights.  We did discuss suppression dosing of Metrogel for recurrent bv.  Fu prn.    An After Visit Summary was printed and given to the patient.  20 min  total time was spent for this patient encounter, including preparation, face-to-face counseling with the patient, coordination of care, and documentation of  the encounter.

## 2021-02-20 DIAGNOSIS — J039 Acute tonsillitis, unspecified: Secondary | ICD-10-CM | POA: Diagnosis not present

## 2021-02-22 DIAGNOSIS — R509 Fever, unspecified: Secondary | ICD-10-CM | POA: Diagnosis not present

## 2021-02-22 DIAGNOSIS — J039 Acute tonsillitis, unspecified: Secondary | ICD-10-CM | POA: Diagnosis not present

## 2021-02-22 DIAGNOSIS — Z03818 Encounter for observation for suspected exposure to other biological agents ruled out: Secondary | ICD-10-CM | POA: Diagnosis not present

## 2021-04-07 DIAGNOSIS — E785 Hyperlipidemia, unspecified: Secondary | ICD-10-CM | POA: Diagnosis not present

## 2021-04-07 DIAGNOSIS — R7303 Prediabetes: Secondary | ICD-10-CM | POA: Diagnosis not present

## 2021-04-07 DIAGNOSIS — E049 Nontoxic goiter, unspecified: Secondary | ICD-10-CM | POA: Diagnosis not present

## 2021-04-07 DIAGNOSIS — E559 Vitamin D deficiency, unspecified: Secondary | ICD-10-CM | POA: Diagnosis not present

## 2021-04-07 DIAGNOSIS — Z0000 Encounter for general adult medical examination without abnormal findings: Secondary | ICD-10-CM | POA: Diagnosis not present

## 2021-04-15 DIAGNOSIS — H5213 Myopia, bilateral: Secondary | ICD-10-CM | POA: Diagnosis not present

## 2021-05-29 DIAGNOSIS — B349 Viral infection, unspecified: Secondary | ICD-10-CM | POA: Diagnosis not present

## 2021-07-17 DIAGNOSIS — J302 Other seasonal allergic rhinitis: Secondary | ICD-10-CM | POA: Diagnosis not present

## 2021-07-17 DIAGNOSIS — J029 Acute pharyngitis, unspecified: Secondary | ICD-10-CM | POA: Diagnosis not present

## 2021-09-01 ENCOUNTER — Ambulatory Visit: Payer: BC Managed Care – PPO | Admitting: Obstetrics & Gynecology

## 2021-09-18 ENCOUNTER — Encounter: Payer: Self-pay | Admitting: Obstetrics & Gynecology

## 2021-09-18 ENCOUNTER — Ambulatory Visit (INDEPENDENT_AMBULATORY_CARE_PROVIDER_SITE_OTHER): Payer: BC Managed Care – PPO | Admitting: Obstetrics & Gynecology

## 2021-09-18 ENCOUNTER — Other Ambulatory Visit (HOSPITAL_COMMUNITY)
Admission: RE | Admit: 2021-09-18 | Discharge: 2021-09-18 | Disposition: A | Discharge status: Home / Self Care | Payer: BC Managed Care – PPO | Source: Ambulatory Visit | Attending: Obstetrics & Gynecology | Admitting: Obstetrics & Gynecology

## 2021-09-18 VITALS — BP 104/68 | HR 75 | Ht 60.25 in | Wt 113.0 lb

## 2021-09-18 DIAGNOSIS — N870 Mild cervical dysplasia: Secondary | ICD-10-CM | POA: Diagnosis not present

## 2021-09-18 DIAGNOSIS — N898 Other specified noninflammatory disorders of vagina: Secondary | ICD-10-CM | POA: Diagnosis not present

## 2021-09-18 DIAGNOSIS — Z01419 Encounter for gynecological examination (general) (routine) without abnormal findings: Secondary | ICD-10-CM | POA: Diagnosis not present

## 2021-09-18 DIAGNOSIS — Z9189 Other specified personal risk factors, not elsewhere classified: Secondary | ICD-10-CM | POA: Diagnosis not present

## 2021-09-18 LAB — WET PREP FOR TRICH, YEAST, CLUE

## 2021-09-18 MED ORDER — TINIDAZOLE 500 MG PO TABS: 1000.0000 mg | ORAL_TABLET | Freq: Two times a day (BID) | ORAL | 0 refills | Status: AC

## 2021-09-18 NOTE — Progress Notes (Signed)
Laura Wells April 15, 1975 [id number]   History:    46 y.o. G3P3L3 Married.  Vasectomy.  Children 17-20-22]   RP:  Established patient presenting for annual gyn exam    HPI: Normal menstrual periods with light flow every 3- 4 wks.  No breakthrough bleeding.  No pelvic pain.  No pain with intercourse. H/O CIN 1.  Pap Neg 07/2020.  Pap reflex today. Had Yeast Vaginitis, better with Boric Acid.  Breasts normal. Mammo Neg 09/2020.  Body mass index 21.89. Good nutrition.  Planning to get back into better fitness.  Health labs with family physician.   Past medical history,surgical history, family history and social history were all reviewed and documented in the EPIC chart.  Gynecologic History Patient's last menstrual period was 09/01/2021 (exact date).  Obstetric History OB History  Gravida Para Term Preterm AB Living  3 3 3     3   SAB IAB Ectopic Multiple Live Births               # Outcome Date GA Lbr Len/2nd Weight Sex Delivery Anes PTL Lv  3 Term           2 Term           1 Term              ROS: A ROS was performed and pertinent positives and negatives are included in the history.  GENERAL: No fevers or chills. HEENT: No change in vision, no earache, sore throat or sinus congestion. NECK: No pain or stiffness. CARDIOVASCULAR: No chest pain or pressure. No palpitations. PULMONARY: No shortness of breath, cough or wheeze. GASTROINTESTINAL: No abdominal pain, nausea, vomiting or diarrhea, melena or bright red blood per rectum. GENITOURINARY: No urinary frequency, urgency, hesitancy or dysuria. MUSCULOSKELETAL: No joint or muscle pain, no back pain, no recent trauma. DERMATOLOGIC: No rash, no itching, no lesions. ENDOCRINE: No polyuria, polydipsia, no heat or cold intolerance. No recent change in weight. HEMATOLOGICAL: No anemia or easy bruising or bleeding. NEUROLOGIC: No headache, seizures, numbness, tingling or weakness. PSYCHIATRIC: No depression, no loss of interest in normal  activity or change in sleep pattern.     Exam:   BP 104/68   Pulse 75   Ht 5' 0.25" (1.53 m)   Wt 113 lb (51.3 kg)   LMP 09/01/2021 (Exact Date)   SpO2 99%   BMI 21.89 kg/m   Body mass index is 21.89 kg/m.  General appearance : Well developed well nourished female. No acute distress HEENT: Eyes: no retinal hemorrhage or exudates,  Neck supple, trachea midline, no carotid bruits, no thyroidmegaly Lungs: Clear to auscultation, no rhonchi or wheezes, or rib retractions  Heart: Regular rate and rhythm, no murmurs or gallops Breast:Examined in sitting and supine position were symmetrical in appearance, no palpable masses or tenderness,  no skin retraction, no nipple inversion, no nipple discharge, no skin discoloration, no axillary or supraclavicular lymphadenopathy Abdomen: no palpable masses or tenderness, no rebound or guarding Extremities: no edema or skin discoloration or tenderness  Pelvic: Vulva: Normal             Vagina: No gross lesions.  Mild discharge, wet prep done.  Cervix: No gross lesions or discharge.  Pap reflex done.  Uterus  AV, normal size, shape and consistency, non-tender and mobile  Adnexa  Without masses or tenderness  Anus: Normal  Wet prep:  Clue cells with odor present   Assessment/Plan:  46 y.o. female for annual exam  1. Encounter for routine gynecological examination with Papanicolaou smear of cervix Normal menstrual periods with light flow every 3- 4 wks.  No breakthrough bleeding.  No pelvic pain.  No pain with intercourse. H/O CIN 1.  Pap Neg 07/2020.  Pap reflex today. Had Yeast Vaginitis, better with Boric Acid.  Breasts normal. Mammo Neg 09/2020.  Body mass index 21.89. Good nutrition.  Planning to get back into better fitness.  Health labs with family physician. - Cytology - PAP( [place])  2. Dysplasia of cervix, low grade (CIN 1) - Cytology - PAP( [place])  3. Relies on partner vasectomy for contraception  4. Vaginal  discharge Bacterial vaginosis confirmed by wet prep.  Treat with Tinidazole.  Usage reviewed, prescription sent to pharmacy. - WET PREP FOR TRICH, YEAST, CLUE  Other orders - tinidazole (TINDAMAX) 500 MG tablet; Take 2 tablets (1,000 mg total) by mouth 2 (two) times daily for 2 days.   [name], 3:19 PM 09/18/2021

## 2021-09-23 LAB — CYTOLOGY - PAP: Diagnosis: NEGATIVE

## 2021-11-09 DIAGNOSIS — J029 Acute pharyngitis, unspecified: Secondary | ICD-10-CM | POA: Diagnosis not present

## 2022-03-29 DIAGNOSIS — R0989 Other specified symptoms and signs involving the circulatory and respiratory systems: Secondary | ICD-10-CM | POA: Diagnosis not present

## 2022-03-29 DIAGNOSIS — R059 Cough, unspecified: Secondary | ICD-10-CM | POA: Diagnosis not present

## 2022-04-08 ENCOUNTER — Ambulatory Visit
Admission: RE | Admit: 2022-04-08 | Discharge: 2022-04-08 | Disposition: A | Discharge status: Home / Self Care | Payer: BC Managed Care – PPO | Source: Ambulatory Visit | Attending: Internal Medicine | Admitting: Internal Medicine

## 2022-04-08 ENCOUNTER — Other Ambulatory Visit: Payer: Self-pay | Admitting: Internal Medicine

## 2022-04-08 DIAGNOSIS — R7303 Prediabetes: Secondary | ICD-10-CM | POA: Diagnosis not present

## 2022-04-08 DIAGNOSIS — R059 Cough, unspecified: Secondary | ICD-10-CM | POA: Diagnosis not present

## 2022-04-08 DIAGNOSIS — R058 Other specified cough: Secondary | ICD-10-CM

## 2022-04-08 DIAGNOSIS — E559 Vitamin D deficiency, unspecified: Secondary | ICD-10-CM | POA: Diagnosis not present

## 2022-04-08 DIAGNOSIS — E049 Nontoxic goiter, unspecified: Secondary | ICD-10-CM | POA: Diagnosis not present

## 2022-04-08 DIAGNOSIS — Z0000 Encounter for general adult medical examination without abnormal findings: Secondary | ICD-10-CM | POA: Diagnosis not present

## 2022-04-08 DIAGNOSIS — E7800 Pure hypercholesterolemia, unspecified: Secondary | ICD-10-CM | POA: Diagnosis not present

## 2022-04-15 DIAGNOSIS — B349 Viral infection, unspecified: Secondary | ICD-10-CM | POA: Diagnosis not present

## 2022-05-19 ENCOUNTER — Ambulatory Visit: Payer: BC Managed Care – PPO | Admitting: Obstetrics & Gynecology

## 2022-05-19 ENCOUNTER — Encounter: Payer: Self-pay | Admitting: Obstetrics & Gynecology

## 2022-05-19 VITALS — BP 110/72 | HR 88

## 2022-05-19 DIAGNOSIS — N951 Menopausal and female climacteric states: Secondary | ICD-10-CM | POA: Diagnosis not present

## 2022-05-19 DIAGNOSIS — E559 Vitamin D deficiency, unspecified: Secondary | ICD-10-CM | POA: Diagnosis not present

## 2022-05-19 DIAGNOSIS — N921 Excessive and frequent menstruation with irregular cycle: Secondary | ICD-10-CM | POA: Diagnosis not present

## 2022-05-19 DIAGNOSIS — N926 Irregular menstruation, unspecified: Secondary | ICD-10-CM

## 2022-05-19 DIAGNOSIS — Z9189 Other specified personal risk factors, not elsewhere classified: Secondary | ICD-10-CM

## 2022-05-19 NOTE — Progress Notes (Signed)
    Laura Wells Aug 15, 1975 [id number]        47 y.o.  G3P3L3   RP: Irregular menses with BTB  HPI: Irregular menses with BTB x about 3 months.  Previously had normal periods every month.  Spaced to 6-7 weeks at LMP on 05/05/22 and has been bleeding since then.  The flow is rather light and fluidy, occasional small clot coming out when going to the bathroom.  No postcoital bleeding before the LMP.  Husband vasectomized. Minimal hot flushes.   OB History  Gravida Para Term Preterm AB Living  3 3 3     3  $ SAB IAB Ectopic Multiple Live Births               # Outcome Date GA Lbr Len/2nd Weight Sex Delivery Anes PTL Lv  3 Term           2 Term           1 Term             Past medical history,surgical history, problem list, medications, allergies, family history and social history were all reviewed and documented in the EPIC chart.   Directed ROS with pertinent positives and negatives documented in the history of present illness/assessment and plan.  Exam:  Vitals:   05/19/22 1145  BP: 110/72  Pulse: 88  SpO2: 99%   General appearance:  Normal  Abdomen: Normal  Gynecologic exam: Vulva normal.  Speculum: Cervix/vagina normal. Dark thin blood at the upper vagina coming from the exocervix. Bimanual exam: Uterus AV, mobile, normal volume, NT.  No adnexal mass, NT.   Assessment/Plan:  47 y.o. G3P3L3   1. Irregular menses Irregular menses with BTB x about 3 months.  Previously had normal periods every month.  Spaced to 6-7 weeks at LMP on 05/05/22 and has been bleeding since then.  The flow is rather light and fluidy, occasional small clot coming out when going to the bathroom.  No postcoital bleeding before the LMP.  Husband vasectomized. Minimal hot flushes. Will further investigate with a pelvic US. Ong today. - [facility] - US Transvaginal Non-OB; Future  2. Metrorrhagia Will further investigate with a pelvic US. - [facility] - US Transvaginal Non-OB; Future  3. Perimenopause -  [facility]  4. Relies on partner vasectomy for contraception  Other orders - cetirizine (ZYRTEC ALLERGY) 10 MG tablet; 1 tablet Orally Once a day   Princess Bruins MD, 11:51 AM 05/19/2022

## 2022-05-20 LAB — FOLLICLE STIMULATING HORMONE: FSH: 20.9 m[IU]/mL

## 2022-05-24 ENCOUNTER — Other Ambulatory Visit: Payer: Self-pay | Admitting: *Deleted

## 2022-05-24 MED ORDER — MEGESTROL ACETATE 20 MG PO TABS: 20.0000 mg | ORAL_TABLET | Freq: Two times a day (BID) | ORAL | 0 refills | Status: DC

## 2022-05-24 NOTE — Telephone Encounter (Signed)
Laura Bruins, MD sent to Enrigue Catena, RMA Keep [place] on 06/24/2022.  Offer Megace 20 mg PO BID until [place]. Dr Carlean Jews

## 2022-05-26 ENCOUNTER — Telehealth: Payer: Self-pay

## 2022-05-26 NOTE — Telephone Encounter (Signed)
Patient called in voice mail stating she had found two small lumps in her neck and is very worried. Her PCP could not see her for 2 weeks and she wanted to see if Dr. Marguerita Merles could see her to reassure her.  I confered with Dr. Marguerita Merles who advised patient should call PCP back and relay how upset she is and see if they can facilitate earlier appt.  Not appropriate for Dr. Marguerita Merles to examine and order imaging on her neck  When I called patient back she had already talked with PCP again and now had appt scheduled 05/28/22. She was completely understanding about need to see PCP.

## 2022-05-28 DIAGNOSIS — R590 Localized enlarged lymph nodes: Secondary | ICD-10-CM | POA: Diagnosis not present

## 2022-05-28 DIAGNOSIS — N939 Abnormal uterine and vaginal bleeding, unspecified: Secondary | ICD-10-CM | POA: Diagnosis not present

## 2022-06-01 ENCOUNTER — Other Ambulatory Visit: Payer: Self-pay | Admitting: Internal Medicine

## 2022-06-01 DIAGNOSIS — R590 Localized enlarged lymph nodes: Secondary | ICD-10-CM

## 2022-06-07 ENCOUNTER — Other Ambulatory Visit: Payer: BC Managed Care – PPO

## 2022-06-21 ENCOUNTER — Telehealth: Payer: Self-pay

## 2022-06-21 NOTE — Telephone Encounter (Signed)
Patient called because she is scheduled for u/s on Thursday this week and her month long bleeding has stopped. She asked if she should still come.  Advised patient to keep appt. Office note documents a 3 mos hx of AUB and needs to be assessed.  Patient agrees and will keep appt on Thursday.

## 2022-06-24 ENCOUNTER — Ambulatory Visit (INDEPENDENT_AMBULATORY_CARE_PROVIDER_SITE_OTHER): Payer: BC Managed Care – PPO

## 2022-06-24 ENCOUNTER — Ambulatory Visit: Payer: BC Managed Care – PPO | Admitting: Obstetrics & Gynecology

## 2022-06-24 ENCOUNTER — Encounter: Payer: Self-pay | Admitting: Obstetrics & Gynecology

## 2022-06-24 VITALS — BP 118/78 | HR 77

## 2022-06-24 DIAGNOSIS — R9389 Abnormal findings on diagnostic imaging of other specified body structures: Secondary | ICD-10-CM

## 2022-06-24 DIAGNOSIS — N921 Excessive and frequent menstruation with irregular cycle: Secondary | ICD-10-CM

## 2022-06-24 DIAGNOSIS — N926 Irregular menstruation, unspecified: Secondary | ICD-10-CM | POA: Diagnosis not present

## 2022-06-24 NOTE — Progress Notes (Signed)
    Laura Wells Nov 15, 1975 [id number]        47 y.o.  G3P3L3 Married  RP: Irregular menses for Pelvic US  HPI:  Irregular menses with BTB x about 4 months.  Previously had normal periods every month.  Spaced to 6-7 weeks at LMP on 05/05/22 and bled until 05/25/22.  The flow was rather light and fluidy, occasional small clot coming out when going to the bathroom.  No Pelvic Pain.  No postcoital bleeding before the LMP.  Husband vasectomized. Minimal hot flushes.   OB History  Gravida Para Term Preterm AB Living  3 3 3     3   SAB IAB Ectopic Multiple Live Births               # Outcome Date GA Lbr Len/2nd Weight Sex Delivery Anes PTL Lv  3 Term           2 Term           1 Term             Past medical history,surgical history, problem list, medications, allergies, family history and social history were all reviewed and documented in the EPIC chart.   Directed ROS with pertinent positives and negatives documented in the history of present illness/assessment and plan.  Exam:  There were no vitals filed for this visit. General appearance:  Normal  Pelvic US today: T/V images.  Anteverted uterus normal in size and shape with no myometrial mass.  The uterus is measured at 8.46 x 5.75 x 4.54 cm.  Irregular endometrium measured at 10.11 mm with cystic change and a feeder vessels identified to an area suggesting a 1 cm intracavitary mass by 3D imaging.  Both ovaries are mobile, normal in size with normal follicular pattern and normal perfusion.  The right ovary presents a 1.7 cm simple follicle and the left ovary has a 2.2 cm simple follicle.  No adnexal mass.  No free fluid in the pelvis.   Assessment/Plan:  47 y.o. G3P3003   1. Irregular menses Irregular menses with BTB x about 4 months.  Previously had normal periods every month.  Spaced to 6-7 weeks at LMP on 05/05/22 and bled until 05/25/22.  The flow was rather light and fluidy, occasional small clot coming out when going to the  bathroom.  No Pelvic Pain.  No postcoital bleeding before the LMP.  Husband vasectomized. Minimal hot flushes.  2. Thickened endometrium  Irregular endometrium measured at 10.11 mm with cystic change and a feeder vessels identified to an area suggesting a 1 cm intracavitary mass by 3D imaging. 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  Findings thoroughly reviewed with patient.  Will schedule a Sonohysto to further investigate, EBx per findings. - [place] Sonohysterogram; Future   Princess Bruins MD, 4:18 PM 06/24/2022

## 2022-08-12 ENCOUNTER — Ambulatory Visit (INDEPENDENT_AMBULATORY_CARE_PROVIDER_SITE_OTHER): Payer: BC Managed Care – PPO | Admitting: Obstetrics & Gynecology

## 2022-08-12 ENCOUNTER — Telehealth: Payer: Self-pay | Admitting: *Deleted

## 2022-08-12 ENCOUNTER — Other Ambulatory Visit (HOSPITAL_COMMUNITY)
Admission: RE | Admit: 2022-08-12 | Discharge: 2022-08-12 | Disposition: A | Discharge status: Home / Self Care | Payer: BC Managed Care – PPO | Source: Ambulatory Visit | Attending: Obstetrics & Gynecology | Admitting: Obstetrics & Gynecology

## 2022-08-12 ENCOUNTER — Other Ambulatory Visit: Payer: Self-pay | Admitting: Obstetrics & Gynecology

## 2022-08-12 ENCOUNTER — Ambulatory Visit (INDEPENDENT_AMBULATORY_CARE_PROVIDER_SITE_OTHER): Payer: BC Managed Care – PPO

## 2022-08-12 ENCOUNTER — Encounter: Payer: Self-pay | Admitting: Obstetrics & Gynecology

## 2022-08-12 VITALS — BP 112/70 | HR 69 | Ht 60.25 in | Wt 115.0 lb

## 2022-08-12 DIAGNOSIS — N926 Irregular menstruation, unspecified: Secondary | ICD-10-CM | POA: Insufficient documentation

## 2022-08-12 DIAGNOSIS — R9389 Abnormal findings on diagnostic imaging of other specified body structures: Secondary | ICD-10-CM

## 2022-08-12 DIAGNOSIS — N840 Polyp of corpus uteri: Secondary | ICD-10-CM

## 2022-08-12 NOTE — Progress Notes (Signed)
Laura Wells [birth year] [id number]        47 y.o.  G3P3003 Married.  Husband vasectomized  RP: Menometrorrhagia with thickened endometrial line for Sonohysto  HPI: Patient seen on 06/24/2022, since then has continued to have frequent BTB.  Irregular menses with BTB x about 5 months.  Previously had normal periods every month. No Pelvic Pain.  No postcoital bleeding.  Husband vasectomized. Minimal hot flushes.    OB History  Gravida Para Term Preterm AB Living  3 3 3     3   SAB IAB Ectopic Multiple Live Births               # Outcome Date GA Lbr Len/2nd Weight Sex Delivery Anes PTL Lv  3 Term           2 Term           1 Term             Past medical history,surgical history, problem list, medications, allergies, family history and social history were all reviewed and documented in the EPIC chart.   Directed ROS with pertinent positives and negatives documented in the history of present illness/assessment and plan.  Exam:  General appearance:  Normal              Sono Infusion Hysterogram ( procedure note)   The initial transvaginal ultrasound demonstrated the following:  Comparison is made with previous scan on July 14, 2022.  T/V images.  Anteverted uterus, normal in size and shape with no myometrial mass.  Thickened and irregular endometrium remaining at 8.4 mm in thickness.  Feeder vessel identified to area of thickening.  Right ovary with a 1.9 x 1.4 cm simple cystic structure.  Left ovary normal.  No adnexal mass.  No free fluid in the pelvis.  The speculum  was inserted and the cervix cleansed with Betadine solution after confirming that patient has no allergies.A small sonohysterography catheterwas utilized.  Insertion was facilitated with ring forceps, using a spear-like motion the catheter was inserted to the fundus of the uterus. The speculum is then removed carefully to avoid dislodging the catheter. The catheter was flushed with sterile saline delete prior to  insertion to rid it of small amounts of air.the sterile saline solution was infused into the uterine cavity as a vaginal ultrasound probe was then placed in the vagina for full visualization of the uterine cavity from a transvaginal approach. The following was noted:  Thickened endometrial walls at 9.9 mm with focal thickening anteriorly to the left in the area where the feeder vessel was visualized, compatible with an endometrial polyp.  The catheter was then removed after retrieving some of the saline from the intrauterine cavity. An endometrial biopsy was done. Patient tolerated procedure well. She had received a tablet of Aleve for discomfort.    Assessment/Plan:  47 y.o. G3P3003   1. Irregular menses Patient seen on 06/24/2022, since then has continued to have frequent BTB.  Irregular menses with BTB x about 5 months.  Previously had normal periods every month. No Pelvic Pain.  No postcoital bleeding.  Husband vasectomized. Minimal hot flushes.  Pelvic US and Sonohysto results reviewed.  Probable IU lesion c/w an endometrial polyp.  EBx done, patho pending.  Decision to proceed with HSC/Myosure Excision/D+C.  Preop preparation, Surgery and risks including uterine perforation, postop precautions and expectations thoroughly reviewed.   - Surgical pathology( [facility]/ [facility])  2. Thickened endometrium As above. - Surgical pathology( [facility]/ [facility])  3. Endometrial polyp  HSC/Myosure Excision/D+C.  Preop done today.                         Patient was counseled as to the risk of surgery to include the following:  1. Infection (prohylactic antibiotics will be administered)  2. DVT/Pulmonary Embolism (prophylactic pneumo compression stockings will be used)  3.Trauma to internal organs requiring additional surgical procedure to repair any injury to internal organs requiring perhaps additional hospitalization days.  4.Hemmorhage requiring transfusion and blood products which  carry risks such as anaphylactic reaction, hepatitis and AIDS  Patient had received literature information on the procedure scheduled and all her questions were answered and fully accepts all risk.   [name], 3:06 PM 08/12/2022

## 2022-08-12 NOTE — Telephone Encounter (Signed)
-----   Message from Genia Del, MD sent at 08/12/2022  3:09 PM EDT ----- Regarding: Schedule Surgery Surgery: CPT [phone] - Hysteroscopy/D&C/Myosure  Diagnosis: R93.89 Thickened Endometrium, N84.0 Endometrial Polyp, N92.1 Menometrorrhagia  Location: [facility]  Status: Outpatient  Time: 30 Minutes  Assistant: N/A  Urgency: First Available  Pre-Op Appointment: Completed  Post-Op Appointment(s): 2 Weeks  Time Out Of Work: Day Of Surgery ONLY

## 2022-08-13 ENCOUNTER — Encounter (HOSPITAL_BASED_OUTPATIENT_CLINIC_OR_DEPARTMENT_OTHER): Payer: Self-pay | Admitting: Obstetrics & Gynecology

## 2022-08-13 ENCOUNTER — Other Ambulatory Visit: Payer: Self-pay

## 2022-08-13 NOTE — Telephone Encounter (Signed)
Left message to call Saragrace Selke, RN at [facility], [phone], option 5.  

## 2022-08-13 NOTE — Telephone Encounter (Signed)
Spoke with patient. Reviewed surgery dates. Patient request to proceed with surgery on 08/25/22.  Advised patient I will forward to business office for return call. I will return call once surgery date and time confirmed. Patient verbalizes understanding and is agreeable.   Surgery request sent.   To Miranda for benefits.

## 2022-08-13 NOTE — Progress Notes (Signed)
Spoke w/ via phone for pre-op interview---pt Lab needs dos----UPT               Lab results------n/a COVID test -----patient states asymptomatic no test needed Arrive at -------0845 NPO after MN NO Solid Food.  Clear liquids from MN until---0745 Med rec completed Medications to take morning of surgery -----none Diabetic medication -----n/a Patient instructed no nail polish to be worn day of surgery Patient instructed to bring photo id and insurance card day of surgery Patient aware to have Driver (ride ) / caregiver husband Laura Wells   for 24 hours after surgery  Patient Special Instructions -----none Pre-Op special Instructions -----none Patient verbalized understanding of instructions that were given at this phone interview. Patient denies shortness of breath, chest pain, fever, cough at this phone interview.

## 2022-08-17 LAB — SURGICAL PATHOLOGY

## 2022-08-18 NOTE — Telephone Encounter (Signed)
Spoke with patient. Surgery date request confirmed.  Advised surgery is scheduled for [facility], 08/25/22, at 1050.  Surgery instruction sheet and hospital brochure reviewed, copy sent to patient by MyChart message.   Patient verbalizes understanding and is agreeable.  Routing to provider. Encounter closed.

## 2022-08-24 NOTE — Anesthesia Preprocedure Evaluation (Signed)
Anesthesia Evaluation  Patient identified by MRN, date of birth, ID band Patient awake    Reviewed: Allergy & Precautions, NPO status , Patient's Chart, lab work & pertinent test results  History of Anesthesia Complications Negative for: history of anesthetic complications  Airway Mallampati: III  TM Distance: >3 FB Neck ROM: Full    Dental  (+) Dental Advisory Given   Pulmonary neg pulmonary ROS   Pulmonary exam normal breath sounds clear to auscultation       Cardiovascular negative cardio ROS  Rhythm:Regular Rate:Normal     Neuro/Psych negative neurological ROS     GI/Hepatic negative GI ROS, Neg liver ROS,,,  Endo/Other  negative endocrine ROS    Renal/GU negative Renal ROS     Musculoskeletal   Abdominal   Peds  Hematology negative hematology ROS (+)   Anesthesia Other Findings   Reproductive/Obstetrics                             Anesthesia Physical Anesthesia Plan  ASA: 2  Anesthesia Plan: General   Post-op Pain Management: Tylenol PO (pre-op)*   Induction: Intravenous  PONV Risk Score and Plan: 3 and Ondansetron, Dexamethasone and Treatment may vary due to age or medical condition  Airway Management Planned: LMA  Additional Equipment:   Intra-op Plan:   Post-operative Plan: Extubation in OR  Informed Consent: I have reviewed the patients History and Physical, chart, labs and discussed the procedure including the risks, benefits and alternatives for the proposed anesthesia with the patient or authorized representative who has indicated his/her understanding and acceptance.     Dental advisory given  Plan Discussed with: CRNA and Anesthesiologist  Anesthesia Plan Comments: (Risks of general anesthesia discussed including, but not limited to, sore throat, hoarse voice, chipped/damaged teeth, injury to vocal cords, nausea and vomiting, allergic reactions, lung  infection, heart attack, stroke, and death. All questions answered. )       Anesthesia Quick Evaluation

## 2022-08-25 ENCOUNTER — Ambulatory Visit (HOSPITAL_BASED_OUTPATIENT_CLINIC_OR_DEPARTMENT_OTHER)
Admission: RE | Admit: 2022-08-25 | Discharge: 2022-08-25 | Disposition: A | Discharge status: Home / Self Care | Payer: BC Managed Care – PPO | Attending: Obstetrics & Gynecology | Admitting: Obstetrics & Gynecology

## 2022-08-25 ENCOUNTER — Ambulatory Visit (HOSPITAL_BASED_OUTPATIENT_CLINIC_OR_DEPARTMENT_OTHER): Payer: BC Managed Care – PPO | Admitting: Anesthesiology

## 2022-08-25 ENCOUNTER — Encounter (HOSPITAL_BASED_OUTPATIENT_CLINIC_OR_DEPARTMENT_OTHER)
Admission: RE | Disposition: A | Discharge status: Home / Self Care | Payer: Self-pay | Source: Home / Self Care | Attending: Obstetrics & Gynecology

## 2022-08-25 ENCOUNTER — Other Ambulatory Visit: Payer: Self-pay

## 2022-08-25 ENCOUNTER — Encounter (HOSPITAL_BASED_OUTPATIENT_CLINIC_OR_DEPARTMENT_OTHER): Payer: Self-pay | Admitting: Obstetrics & Gynecology

## 2022-08-25 DIAGNOSIS — Z01818 Encounter for other preprocedural examination: Secondary | ICD-10-CM

## 2022-08-25 DIAGNOSIS — R9389 Abnormal findings on diagnostic imaging of other specified body structures: Secondary | ICD-10-CM | POA: Diagnosis not present

## 2022-08-25 DIAGNOSIS — Z419 Encounter for procedure for purposes other than remedying health state, unspecified: Secondary | ICD-10-CM

## 2022-08-25 DIAGNOSIS — N840 Polyp of corpus uteri: Secondary | ICD-10-CM | POA: Insufficient documentation

## 2022-08-25 DIAGNOSIS — N921 Excessive and frequent menstruation with irregular cycle: Secondary | ICD-10-CM | POA: Diagnosis not present

## 2022-08-25 DIAGNOSIS — N8003 Adenomyosis of the uterus: Secondary | ICD-10-CM | POA: Insufficient documentation

## 2022-08-25 HISTORY — DX: Presence of spectacles and contact lenses: Z97.3

## 2022-08-25 HISTORY — PX: DILATATION & CURETTAGE/HYSTEROSCOPY WITH MYOSURE: SHX6511

## 2022-08-25 LAB — CBC
HCT: 35.5 % — ABNORMAL LOW (ref 36.0–46.0)
Hemoglobin: 11.2 g/dL — ABNORMAL LOW (ref 12.0–15.0)
MCH: 23.7 pg — ABNORMAL LOW (ref 26.0–34.0)
MCHC: 31.5 g/dL (ref 30.0–36.0)
MCV: 75.2 fL — ABNORMAL LOW (ref 80.0–100.0)
Platelets: 296 10*3/uL (ref 150–400)
RBC: 4.72 MIL/uL (ref 3.87–5.11)
RDW: 15.2 % (ref 11.5–15.5)
WBC: 5.2 10*3/uL (ref 4.0–10.5)
nRBC: 0 % (ref 0.0–0.2)

## 2022-08-25 LAB — POCT PREGNANCY, URINE: Preg Test, Ur: NEGATIVE

## 2022-08-25 SURGERY — DILATATION & CURETTAGE/HYSTEROSCOPY WITH MYOSURE
Anesthesia: General | Site: Uterus

## 2022-08-25 MED ORDER — LACTATED RINGERS IV SOLN: INTRAVENOUS | Status: DC

## 2022-08-25 MED ORDER — CEFAZOLIN SODIUM-DEXTROSE 2-4 GM/100ML-% IV SOLN
2.0000 g | INTRAVENOUS | Status: AC
  Administered 2022-08-25: 2 g via INTRAVENOUS

## 2022-08-25 MED ORDER — OXYCODONE HCL 5 MG PO TABS: 5.0000 mg | ORAL_TABLET | Freq: Once | ORAL | Status: DC | PRN

## 2022-08-25 MED ORDER — ACETAMINOPHEN 500 MG PO TABS
1000.0000 mg | ORAL_TABLET | Freq: Once | ORAL | Status: AC
  Administered 2022-08-25: 1000 mg via ORAL

## 2022-08-25 MED ORDER — MIDAZOLAM HCL 5 MG/5ML IJ SOLN
INTRAMUSCULAR | Status: DC | PRN
  Administered 2022-08-25: 1 mg via INTRAVENOUS

## 2022-08-25 MED ORDER — SODIUM CHLORIDE 0.9 % IR SOLN
Status: DC | PRN
  Administered 2022-08-25: 3000 mL

## 2022-08-25 MED ORDER — PHENYLEPHRINE 80 MCG/ML (10ML) SYRINGE FOR IV PUSH (FOR BLOOD PRESSURE SUPPORT)
PREFILLED_SYRINGE | INTRAVENOUS | Status: AC
  Filled 2022-08-25: qty 10

## 2022-08-25 MED ORDER — DEXAMETHASONE SODIUM PHOSPHATE 10 MG/ML IJ SOLN
INTRAMUSCULAR | Status: DC | PRN
  Administered 2022-08-25: 5 mg via INTRAVENOUS

## 2022-08-25 MED ORDER — ONDANSETRON HCL 4 MG/2ML IJ SOLN
INTRAMUSCULAR | Status: DC | PRN
  Administered 2022-08-25: 4 mg via INTRAVENOUS

## 2022-08-25 MED ORDER — MIDAZOLAM HCL 2 MG/2ML IJ SOLN
INTRAMUSCULAR | Status: AC
  Filled 2022-08-25: qty 2

## 2022-08-25 MED ORDER — KETOROLAC TROMETHAMINE 30 MG/ML IJ SOLN
INTRAMUSCULAR | Status: DC | PRN
  Administered 2022-08-25: 15 mg via INTRAVENOUS

## 2022-08-25 MED ORDER — PHENYLEPHRINE 80 MCG/ML (10ML) SYRINGE FOR IV PUSH (FOR BLOOD PRESSURE SUPPORT)
PREFILLED_SYRINGE | INTRAVENOUS | Status: DC | PRN
  Administered 2022-08-25: 80 ug via INTRAVENOUS
  Administered 2022-08-25: 160 ug via INTRAVENOUS

## 2022-08-25 MED ORDER — POVIDONE-IODINE 10 % EX SWAB: 2.0000 | Freq: Once | CUTANEOUS | Status: DC

## 2022-08-25 MED ORDER — CEFAZOLIN SODIUM-DEXTROSE 2-4 GM/100ML-% IV SOLN
INTRAVENOUS | Status: AC
  Filled 2022-08-25: qty 100

## 2022-08-25 MED ORDER — AMISULPRIDE (ANTIEMETIC) 5 MG/2ML IV SOLN: 10.0000 mg | Freq: Once | INTRAVENOUS | Status: DC | PRN

## 2022-08-25 MED ORDER — LIDOCAINE HCL 1 % IJ SOLN
INTRAMUSCULAR | Status: DC | PRN
  Administered 2022-08-25: 10 mL

## 2022-08-25 MED ORDER — ONDANSETRON HCL 4 MG/2ML IJ SOLN
INTRAMUSCULAR | Status: AC
  Filled 2022-08-25: qty 2

## 2022-08-25 MED ORDER — LIDOCAINE 2% (20 MG/ML) 5 ML SYRINGE
INTRAMUSCULAR | Status: DC | PRN
  Administered 2022-08-25: 60 mg via INTRAVENOUS

## 2022-08-25 MED ORDER — PROPOFOL 10 MG/ML IV BOLUS
INTRAVENOUS | Status: DC | PRN
  Administered 2022-08-25: 160 mg via INTRAVENOUS

## 2022-08-25 MED ORDER — OXYCODONE HCL 5 MG/5ML PO SOLN: 5.0000 mg | Freq: Once | ORAL | Status: DC | PRN

## 2022-08-25 MED ORDER — DEXAMETHASONE SODIUM PHOSPHATE 10 MG/ML IJ SOLN
INTRAMUSCULAR | Status: AC
  Filled 2022-08-25: qty 1

## 2022-08-25 MED ORDER — ACETAMINOPHEN 500 MG PO TABS
ORAL_TABLET | ORAL | Status: AC
  Filled 2022-08-25: qty 2

## 2022-08-25 MED ORDER — FENTANYL CITRATE (PF) 100 MCG/2ML IJ SOLN
INTRAMUSCULAR | Status: DC | PRN
  Administered 2022-08-25 (×2): 25 ug via INTRAVENOUS

## 2022-08-25 MED ORDER — FENTANYL CITRATE (PF) 100 MCG/2ML IJ SOLN: 25.0000 ug | INTRAMUSCULAR | Status: DC | PRN

## 2022-08-25 MED ORDER — FENTANYL CITRATE (PF) 100 MCG/2ML IJ SOLN
INTRAMUSCULAR | Status: AC
  Filled 2022-08-25: qty 2

## 2022-08-25 MED ORDER — LIDOCAINE HCL (PF) 2 % IJ SOLN
INTRAMUSCULAR | Status: AC
  Filled 2022-08-25: qty 5

## 2022-08-25 MED ORDER — PROPOFOL 10 MG/ML IV BOLUS
INTRAVENOUS | Status: AC
  Filled 2022-08-25: qty 20

## 2022-08-25 SURGICAL SUPPLY — 20 items
CATH ROBINSON RED A/P 16FR (CATHETERS) ×1 IMPLANT
DEVICE MYOSURE LITE (MISCELLANEOUS) IMPLANT
DEVICE MYOSURE REACH (MISCELLANEOUS) IMPLANT
DILATOR CANAL MILEX (MISCELLANEOUS) IMPLANT
GAUZE 4X4 16PLY ~~LOC~~+RFID DBL (SPONGE) IMPLANT
GLOVE BIO SURGEON STRL SZ 6.5 (GLOVE) ×1 IMPLANT
GLOVE BIOGEL PI IND STRL 7.0 (GLOVE) ×1 IMPLANT
GOWN STRL REUS W/TWL LRG LVL3 (GOWN DISPOSABLE) ×1 IMPLANT
IV NS IRRIG 3000ML ARTHROMATIC (IV SOLUTION) ×1 IMPLANT
KIT PROCEDURE FLUENT (KITS) ×1 IMPLANT
KIT TURNOVER CYSTO (KITS) ×1 IMPLANT
MYOSURE XL FIBROID (MISCELLANEOUS)
PACK VAGINAL MINOR WOMEN LF (CUSTOM PROCEDURE TRAY) ×1 IMPLANT
PAD OB MATERNITY 4.3X12.25 (PERSONAL CARE ITEMS) ×1 IMPLANT
PAD PREP 24X48 CUFFED NSTRL (MISCELLANEOUS) ×1 IMPLANT
SEAL CERVICAL OMNI LOK (ABLATOR) IMPLANT
SEAL ROD LENS SCOPE MYOSURE (ABLATOR) ×1 IMPLANT
SLEEVE SCD COMPRESS KNEE MED (STOCKING) ×1 IMPLANT
SYSTEM TISS REMOVAL MYOSURE XL (MISCELLANEOUS) IMPLANT
TOWEL OR 17X24 6PK STRL BLUE (TOWEL DISPOSABLE) ×1 IMPLANT

## 2022-08-25 NOTE — Transfer of Care (Signed)
Immediate Anesthesia Transfer of Care Note  Patient: Laura Wells  Procedure(s) Performed: DILATATION & CURETTAGE/HYSTEROSCOPY WITH MYOSURE (Uterus)  Patient Location: PACU  Anesthesia Type:General  Level of Consciousness: awake, alert , oriented, and patient cooperative  Airway & Oxygen Therapy: Patient Spontanous Breathing  Post-op Assessment: Report given to RN and Post -op Vital signs reviewed and stable  Post vital signs: Reviewed and stable  Last Vitals:  Vitals Value Taken Time  BP 116/75 08/25/22 1115  Temp    Pulse 91 08/25/22 1115  Resp 13 08/25/22 1115  SpO2 97 % 08/25/22 1115  Vitals shown include unvalidated device data.  Last Pain:  Vitals:   08/25/22 0910  TempSrc: Oral         Complications: No notable events documented.

## 2022-08-25 NOTE — Op Note (Signed)
Operative Note  08/25/2022  11:12 AM  PATIENT:  Laura Wells  47 y.o. female  PRE-OPERATIVE DIAGNOSIS:  Thickened endometrium, endometrial polyps, menometrorrhagia  POST-OPERATIVE DIAGNOSIS:  Thickened endometrium, endometrial polyps, menometrorrhagia  PROCEDURE:  Procedure(s): DILATATION & CURETTAGE/HYSTEROSCOPY WITH MYOSURE  SURGEON:  Surgeon(s): Genia Del, MD  ANESTHESIA:   general  FINDINGS: Bilateral Ostia well seen.  Thickened endometrium throughout the cavity and possible small endometrial polyps.  DESCRIPTION OF OPERATION: Under general anesthesia with laryngeal mask, the patient is in lithotomy position.  She had emptied her bladder before entering the operating room.  A Betadine prep was done on the suprapubic, vulvar and vaginal areas.  The patient is draped as usual.  Timeout is done.  The vaginal exam reveals an anteverted uterus, normal in volume and mobile.  No adnexal mass.  The speculum is inserted in the vagina and the anterior lip of the cervix is grasped with a tenaculum.  The os finder is used to start the dilation of the cervix.  Hysterometry is at 7 cm.  We dilated the cervix with Pratt dilators up to #19 without difficulty.  The hysteroscope is inserted in the intra uterine cavity.  Inspection of the intra uterine cavity reveals 2 normal ostia.  The endometrial lining is thickened throughout with probable small endometrial polyps.  Pictures are taken.  We used a light MyoSure to excise the thickened endometrium and probable polyps.  Pictures are taken after excisions.  Hemostasis is adequate.  The hysteroscope with MyoSure are removed.  A systematic curettage of the intra uterine cavity on all endometrial surfaces is done with a sharp curette.  The endocervical canal is curetted as well.  The curette is removed from the intra uterine cavity.  All specimens are sent together to pathology.  The tenaculum is removed from the cervix.  Hemostasis is adequate.  The  speculum is removed.  The patient is brought to recovery room in good and stable status.  ESTIMATED BLOOD LOSS: 10 mL FLUID DEFICIT: 150 mL  Intake/Output Summary (Last 24 hours) at 08/25/2022 1112 Last data filed at 08/25/2022 1106 Gross per 24 hour  Intake 800 ml  Output 10 ml  Net 790 ml     BLOOD ADMINISTERED:none   LOCAL MEDICATIONS USED:  LIDOCAINE 1% 10 cc for Paracervical Block  SPECIMEN:  Source of Specimen:  Excision material from thickened endometrium/polyps and endometrial curettings  DISPOSITION OF SPECIMEN:  PATHOLOGY  COUNTS:  YES  PLAN OF CARE: Transfer to PACU  Marie-Lyne LavoieMD11:12 AM

## 2022-08-25 NOTE — Anesthesia Postprocedure Evaluation (Signed)
Anesthesia Post Note  Patient: Laura Wells  Procedure(s) Performed: DILATATION & CURETTAGE/HYSTEROSCOPY WITH MYOSURE (Uterus)     Patient location during evaluation: PACU Anesthesia Type: General Level of consciousness: awake Pain management: pain level controlled Vital Signs Assessment: post-procedure vital signs reviewed and stable Respiratory status: spontaneous breathing, nonlabored ventilation and respiratory function stable Cardiovascular status: blood pressure returned to baseline and stable Postop Assessment: no apparent nausea or vomiting Anesthetic complications: no   No notable events documented.  Last Vitals:  Vitals:   08/25/22 1146 08/25/22 1149  BP: 108/77   Pulse: 67 67  Resp: 10 18  Temp:  (!) 36.4 C  SpO2: 100% 100%    Last Pain:  Vitals:   08/25/22 1149  TempSrc: Oral                 Linton Rump

## 2022-08-25 NOTE — H&P (Signed)
Laura Wells is an 47 y.o. female.[id number] Married.  Husband vasectomized   RP: Hysteroscopy with Myosure Excision and D+C   HPI: No change since visit on 08/12/22. Continues to have frequent BTB.  Irregular menses with BTB x about 5 months.  Previously had normal periods every month. No Pelvic Pain.  No postcoital bleeding.  Husband vasectomized. Minimal hot flushes. On Sonohysto on 08/12/22: Thickened endometrial walls at 9.9 mm with focal thickening anteriorly to the left in the area where the feeder vessel was visualized, compatible with an endometrial polyp. EBx benign on 08/12/22.      Pertinent Gynecological History: Menses:  Irregular with metrorrhagia Contraception: vasectomy Blood transfusions: none Sexually transmitted diseases: no past history Previous GYN Procedures: None Last mammogram: normal  Last pap: normal   Menstrual History: Patient's last menstrual period was 07/21/2022 (exact date).    Past Medical History:  Diagnosis Date   Wears glasses    for distance when driving 52/84/1324    Past Surgical History:  Procedure Laterality Date   NOSE SURGERY      Family History  Problem Relation Age of Onset   Diabetes Father    Thyroid disease Father    Hypertension Father    Thyroid disease Brother    Thyroid disease Daughter    Allergies Son     Social History:  reports that she has never smoked. She has never used smokeless tobacco. She reports that she does not drink alcohol and does not use drugs.  Allergies:  Allergies  Allergen Reactions   Mening Acy&W-135 Diphth Conj     Other Reaction(s): local reaction    Medications Prior to Admission  Medication Sig Dispense Refill Last Dose   cetirizine (ZYRTEC ALLERGY) 10 MG tablet Take 10 mg by mouth as needed.   Unknown    REVIEW OF SYSTEMS: A ROS was performed and pertinent positives and negatives are included in the history. GENERAL: No fevers or chills. HEENT: No change in vision, no earache, sore  throat or sinus congestion. NECK: No pain or stiffness. CARDIOVASCULAR: No chest pain or pressure. No palpitations. PULMONARY: No shortness of breath, cough or wheeze. GASTROINTESTINAL: No abdominal pain, nausea, vomiting or diarrhea, melena or bright red blood per rectum. GENITOURINARY: No urinary frequency, urgency, hesitancy or dysuria. MUSCULOSKELETAL: No joint or muscle pain, no back pain, no recent trauma. DERMATOLOGIC: No rash, no itching, no lesions. ENDOCRINE: No polyuria, polydipsia, no heat or cold intolerance. No recent change in weight. HEMATOLOGICAL: No anemia or easy bruising or bleeding. NEUROLOGIC: No headache, seizures, numbness, tingling or weakness. PSYCHIATRIC: No depression, no loss of interest in normal activity or change in sleep pattern.     Blood pressure 121/79, pulse 72, temperature 97.6 F (36.4 C), temperature source Oral, resp. rate 16, height 5' 0.25" (1.53 m), weight 53.8 kg, last menstrual period 07/21/2022, SpO2 98 %.  Physical Exam:  Sono Infusion Hysterogram ( procedure note)     The initial transvaginal ultrasound demonstrated the following:   Comparison is made with previous scan on July 14, 2022.  T/V images.  Anteverted uterus, normal in size and shape with no myometrial mass.  Thickened and irregular endometrium remaining at 8.4 mm in thickness.  Feeder vessel identified to area of thickening.  Right ovary with a 1.9 x 1.4 cm simple cystic structure.  Left ovary normal.  No adnexal mass.  No free fluid in the pelvis.   The speculum  was inserted and the cervix cleansed with Betadine solution after  confirming that patient has no allergies.A small sonohysterography catheterwas utilized.  Insertion was facilitated with ring forceps, using a spear-like motion the catheter was inserted to the fundus of the uterus. The speculum is then removed carefully to avoid dislodging the catheter. The catheter was flushed with sterile saline delete prior to insertion to rid  it of small amounts of air.the sterile saline solution was infused into the uterine cavity as a vaginal ultrasound probe was then placed in the vagina for full visualization of the uterine cavity from a transvaginal approach. The following was noted:   Thickened endometrial walls at 9.9 mm with focal thickening anteriorly to the left in the area where the feeder vessel was visualized, compatible with an endometrial polyp.   The catheter was then removed after retrieving some of the saline from the intrauterine cavity. An endometrial biopsy was done. Patient tolerated procedure well. She had received a tablet of Aleve for discomfort.    FINAL MICROSCOPIC DIAGNOSIS:   A. ENDOMETRIUM, BIOPSY:       Benign disordered proliferative endometrium.       Negative for hyperplasia, atypia or malignancy.     Assessment/Plan:  47 y.o. G3P3003    1. Irregular menses Patient seen on 06/24/2022, since then has continued to have frequent BTB.  Irregular menses with BTB x about 5 months.  Previously had normal periods every month. No Pelvic Pain.  No postcoital bleeding.  Husband vasectomized. Minimal hot flushes.  Pelvic US and Sonohysto results reviewed.  Probable IU lesion c/w an endometrial polyp.  EBx done, patho benign.  Decision to proceed with HSC/Myosure Excision/D+C.  Preop preparation, Surgery and risks including uterine perforation, postop precautions and expectations thoroughly reviewed.   - Surgical pathology( [place]/ [facility])   2. Thickened endometrium As above. EBx on 08/12/22 was benign.   3. Endometrial polyp  HSC/Myosure Excision/D+C.                            Patient was counseled as to the risk of surgery to include the following:   1. Infection (prohylactic antibiotics will be administered)   2. DVT/Pulmonary Embolism (prophylactic pneumo compression stockings will be used)   3.Trauma to internal organs requiring additional surgical procedure to repair any injury to internal  organs requiring perhaps additional hospitalization days.   4.Hemmorhage requiring transfusion and blood products which carry risks such as anaphylactic reaction, hepatitis and AIDS   Patient had received literature information on the procedure scheduled and all her questions were answered and fully accepts all risk.   Laura Wells 08/25/2022, 9:27 AM

## 2022-08-25 NOTE — Discharge Instructions (Addendum)
  Post Anesthesia Home Care Instructions  Activity: Get plenty of rest for the remainder of the day. A responsible adult should stay with you for 24 hours following the procedure.  For the next 24 hours, DO NOT: -Drive a car -Advertising copywriter -Drink alcoholic beverages -Take any medication unless instructed by your physician -Make any legal decisions or sign important papers.  Meals: Start with liquid foods such as gelatin or soup. Progress to regular foods as tolerated. Avoid greasy, spicy, heavy foods. If nausea and/or vomiting occur, drink only clear liquids until the nausea and/or vomiting subsides. Call your physician if vomiting continues.  Special Instructions/Symptoms: Your throat may feel dry or sore from the anesthesia or the breathing tube placed in your throat during surgery. If this causes discomfort, gargle with warm salt water. The discomfort should disappear within 24 hours.  May take Ibuprofen after 5pm if needed for discomfort. May take Tylenol after 3:30pm in needed for discomfort.   May take Tylenol after 3:30pm if needed for discomfoert.

## 2022-08-25 NOTE — Anesthesia Procedure Notes (Signed)
Procedure Name: LMA Insertion Date/Time: 08/25/2022 10:38 AM  Performed by: Bishop Limbo, CRNAPre-anesthesia Checklist: Patient identified, Emergency Drugs available, Suction available and Patient being monitored Patient Re-evaluated:Patient Re-evaluated prior to induction Oxygen Delivery Method: Circle System Utilized Preoxygenation: Pre-oxygenation with 100% oxygen Induction Type: IV induction Ventilation: Mask ventilation without difficulty LMA: LMA inserted LMA Size: 4.0 Number of attempts: 1 Placement Confirmation: positive ETCO2 Tube secured with: Tape Dental Injury: Teeth and Oropharynx as per pre-operative assessment

## 2022-08-26 ENCOUNTER — Encounter (HOSPITAL_BASED_OUTPATIENT_CLINIC_OR_DEPARTMENT_OTHER): Payer: Self-pay | Admitting: Obstetrics & Gynecology

## 2022-08-26 LAB — SURGICAL PATHOLOGY

## 2022-08-28 ENCOUNTER — Other Ambulatory Visit: Payer: Self-pay

## 2022-09-02 ENCOUNTER — Ambulatory Visit (INDEPENDENT_AMBULATORY_CARE_PROVIDER_SITE_OTHER): Payer: BC Managed Care – PPO | Admitting: Obstetrics & Gynecology

## 2022-09-02 ENCOUNTER — Encounter: Payer: Self-pay | Admitting: Obstetrics & Gynecology

## 2022-09-02 VITALS — BP 120/70 | HR 89 | Ht 60.25 in | Wt 117.0 lb

## 2022-09-02 DIAGNOSIS — Z09 Encounter for follow-up examination after completed treatment for conditions other than malignant neoplasm: Secondary | ICD-10-CM | POA: Diagnosis not present

## 2022-09-02 DIAGNOSIS — Z9189 Other specified personal risk factors, not elsewhere classified: Secondary | ICD-10-CM

## 2022-09-02 DIAGNOSIS — Z01419 Encounter for gynecological examination (general) (routine) without abnormal findings: Secondary | ICD-10-CM

## 2022-09-02 NOTE — Progress Notes (Signed)
Laura Wells Jun 12, 1975 [id number]   History:    47 y.o. G3P3L3 married. Vasectomy.  RP:  Established patient presenting for annual gyn exam and postop  HPI: Post D&C/hysteroscopy with myosure on 08/25/22 for irregular bleeding. Patho Benign. No pelvic pain.  No pain with intercourse. 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  Pap Neg 08/2021.  Repeat Pap at 3 years.  Breasts normal. Mammo Neg 09/2020. Schedule Mammo now.  Body mass index 22.66. Good nutrition.  Planning to get back into better fitness.  Recommend scheduling [facility]. Health labs with family physician.   Past medical history,surgical history, family history and social history were all reviewed and documented in the EPIC chart.  Gynecologic History Patient's last menstrual period was 07/21/2022 (exact date).  Obstetric History OB History  Gravida Para Term Preterm AB Living  3 3 3     3   SAB IAB Ectopic Multiple Live Births               # Outcome Date GA Lbr Len/2nd Weight Sex Delivery Anes PTL Lv  3 Term           2 Term           1 Term              ROS: A ROS was performed and pertinent positives and negatives are included in the history. GENERAL: No fevers or chills. HEENT: No change in vision, no earache, sore throat or sinus congestion. NECK: No pain or stiffness. CARDIOVASCULAR: No chest pain or pressure. No palpitations. PULMONARY: No shortness of breath, cough or wheeze. GASTROINTESTINAL: No abdominal pain, nausea, vomiting or diarrhea, melena or bright red blood per rectum. GENITOURINARY: No urinary frequency, urgency, hesitancy or dysuria. MUSCULOSKELETAL: No joint or muscle pain, no back pain, no recent trauma. DERMATOLOGIC: No rash, no itching, no lesions. ENDOCRINE: No polyuria, polydipsia, no heat or cold intolerance. No recent change in weight. HEMATOLOGICAL: No anemia or easy bruising or bleeding. NEUROLOGIC: No headache, seizures, numbness, tingling or weakness. PSYCHIATRIC: No depression, no loss of interest  in normal activity or change in sleep pattern.     Exam:   BP 120/70   Pulse 89   Ht 5' 0.25" (1.53 m)   Wt 117 lb (53.1 kg)   LMP 07/21/2022 (Exact Date) Comment: not sexually active,husband vasectomy, spotting 6/24  SpO2 98%   BMI 22.66 kg/m   Body mass index is 22.66 kg/m.  General appearance : Well developed well nourished female. No acute distress HEENT: Eyes: no retinal hemorrhage or exudates,  Neck supple, trachea midline, no carotid bruits, no thyroidmegaly Lungs: Clear to auscultation, no rhonchi or wheezes, or rib retractions  Heart: Regular rate and rhythm, no murmurs or gallops Breast:Examined in sitting and supine position were symmetrical in appearance, no palpable masses or tenderness,  no skin retraction, no nipple inversion, no nipple discharge, no skin discoloration, no axillary or supraclavicular lymphadenopathy Abdomen: no palpable masses or tenderness, no rebound or guarding Extremities: no edema or skin discoloration or tenderness  Pelvic: Vulva: Normal             Vagina: No gross lesions or discharge  Cervix: No gross lesions or discharge  Uterus  AV, normal size, shape and consistency, non-tender and mobile  Adnexa  Without masses or tenderness  Anus: Normal  FINAL MICROSCOPIC DIAGNOSIS: 08/25/22  A. ENDOMETRIAL CURETTAGE WITH POSSIBLE POLYP:  -  Polypoid fragments of disordered proliferative endometrium with focal  thick-walled vessels  consistent with polyp.  -  Scattered fragments of smooth muscle/myometrium with evidence of  adenomyosis.  - Negative for hyperplasia/atypia.    Assessment/Plan:  47 y.o. female for annual exam   1. Well female exam with routine gynecological exam Post D&C/hysteroscopy with myosure on 08/25/22 for irregular bleeding. Patho Benign. No pelvic pain.  No pain with intercourse. 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  Pap Neg 08/2021.  Repeat Pap at 3 years.  Breasts normal. Mammo Neg 09/2020. Schedule Mammo now.  Body mass  index 22.66. Good nutrition.  Planning to get back into better fitness.  Recommend scheduling [facility]. Health labs with family physician.   2. Relies on partner vasectomy for contraception  3. Status post gynecological surgery, follow-up exam  Post D&C/hysteroscopy with myosure on 08/25/22 for irregular bleeding. Patho Benign. No pelvic pain.  No pain with intercourse. No vaginal bleeding, normal secretions.    [name], 2:55 PM

## 2022-10-08 DIAGNOSIS — J020 Streptococcal pharyngitis: Secondary | ICD-10-CM | POA: Diagnosis not present

## 2022-11-25 ENCOUNTER — Ambulatory Visit: Payer: BC Managed Care – PPO | Admitting: Podiatry

## 2022-11-25 ENCOUNTER — Ambulatory Visit (INDEPENDENT_AMBULATORY_CARE_PROVIDER_SITE_OTHER): Payer: BC Managed Care – PPO

## 2022-11-25 DIAGNOSIS — M21611 Bunion of right foot: Secondary | ICD-10-CM | POA: Diagnosis not present

## 2022-11-25 DIAGNOSIS — M21612 Bunion of left foot: Secondary | ICD-10-CM | POA: Diagnosis not present

## 2022-11-25 NOTE — Patient Instructions (Signed)

## 2022-11-25 NOTE — Progress Notes (Signed)
Subjective:   Patient ID: Laura Wells, female   DOB: 47 y.o.   MRN: [id number]   HPI Chief Complaint  Patient presents with   Bunions    bil bunions/ possible surgery consult. Left bunion is more painful than the right bunion.      47 year old female presents the office with above concerns.  She states she has had bunions for some time and the left side is worse than the right.  She has changed her lifestyle to accommodate this with shoe modification. It started with the left and then moved the right after driving without shoes.  No recent injuries that she reports.  No other areas of pain.  No other concerns.   Review of Systems  All other systems reviewed and are negative.  Past Medical History:  Diagnosis Date   Wears glasses    for distance when driving 40/98/1191    Past Surgical History:  Procedure Laterality Date   DILATATION & CURETTAGE/HYSTEROSCOPY WITH MYOSURE N/A 08/25/2022   Procedure: DILATATION & CURETTAGE/HYSTEROSCOPY WITH MYOSURE;  Surgeon: Genia Del, MD;  Location: [facility];  Service: Gynecology;  Laterality: N/A;   NOSE SURGERY       Current Outpatient Medications:    cetirizine (ZYRTEC ALLERGY) 10 MG tablet, Take 10 mg by mouth as needed., Disp: , Rfl:   Allergies  Allergen Reactions   Mening Acy&W-135 Diphth Conj     Other Reaction(s): local reaction           Objective:  Physical Exam  General: AAO x3, NAD  Dermatological: Skin is warm, dry and supple bilateral. . There are no open sores, no preulcerative lesions, no rash or signs of infection present.  Vascular: Dorsalis Pedis artery and Posterior Tibial artery pedal pulses are 2/4 bilateral with immedate capillary fill time.  There is no pain with calf compression, swelling, warmth, erythema.   Neruologic: Grossly intact via light touch bilateral.   Musculoskeletal: Moderate bunions are present.  There is hypermobility of first ray however.  There is no pain or  crepitation with MPJ range of motion.  There is no other areas of discomfort identified at this time.  Gait: Unassisted, Nonantalgic.       Assessment:   Bilateral bunion deformity     Plan:  -Treatment options discussed including all alternatives, risks, and complications -Etiology of symptoms were discussed -X-rays were obtained and reviewed with the patient.  3 views of the feet were obtained.  There is no evidence of acute fracture there is a moderate increase in first intermetatarsal angle consistent with a bunion. -We discussed the conservative as well as surgical treatment options.  Conservative discussed shoe modifications, offloading, padding as well as topical Voltaren or other anti-inflammatories or even a steroid injection if needed.  Discussed exercise Of the bunion but discussed this is not going to make the bunion resolve on its own.  We discussed surgical intervention given the hypermobility we discussed Lapidus bunionectomy to help prevent reoccurrence.  After discussion she was to hold off on surgical intervention and continue with conservative treatment.  She will continue to monitor her symptoms.  Vivi Barrack DPM

## 2022-12-01 ENCOUNTER — Ambulatory Visit: Payer: BC Managed Care – PPO | Admitting: Radiology

## 2022-12-01 VITALS — BP 98/76 | HR 68 | Wt 114.8 lb

## 2022-12-01 DIAGNOSIS — N9089 Other specified noninflammatory disorders of vulva and perineum: Secondary | ICD-10-CM

## 2022-12-01 NOTE — Progress Notes (Signed)
      Subjective: Laura Wells is a 47 y.o. female who complains of 'bump' on R labia, first noticed in July 2024 after intercourse. Patient first thought it was a mole, but after few days patient felt that it looked more like a pimple--the bump then popped and drained blood along with a "hard, red ball" (that the patient saved in her fridge in a bag). No pruritus, no pain, no burning on the bump.    Review of Systems  All other systems reviewed and are negative.   Past Medical History:  Diagnosis Date   Wears glasses    for distance when driving 81/19/1478      Objective:  Today's Vitals   12/01/22 1111  BP: 98/76  Pulse: 68  Weight: 114 lb 12.8 oz (52.1 kg)  PainSc: 0-No pain   Body mass index is 22.23 kg/m.   -General: no acute distress -Vulva: without lesions or discharge, mo current mass or bleeding -Vagina: no discharge or lesions -Cervix: no lesion or discharge, no CMT -Perineum: no lesions -Uterus: Mobile, non tender -Adnexa: no masses or tenderness   Maureen Ralphs, RN present for exam.    Assessment:/Plan:  1. Vulvar hematoma Resolved, discussed prevention of reoccurrence    Avoid intercourse until symptoms are resolved. Safe sex encouraged. Avoid the use of soaps or perfumed products in the peri area. Avoid tub baths and sitting in sweaty or wet clothing for prolonged periods of time.

## 2022-12-16 DIAGNOSIS — K648 Other hemorrhoids: Secondary | ICD-10-CM | POA: Diagnosis not present

## 2022-12-16 DIAGNOSIS — Z1211 Encounter for screening for malignant neoplasm of colon: Secondary | ICD-10-CM | POA: Diagnosis not present

## 2022-12-16 LAB — HM COLONOSCOPY

## 2023-04-15 DIAGNOSIS — J988 Other specified respiratory disorders: Secondary | ICD-10-CM | POA: Diagnosis not present

## 2023-07-06 ENCOUNTER — Other Ambulatory Visit: Payer: Self-pay | Admitting: Internal Medicine

## 2023-07-06 DIAGNOSIS — Z1231 Encounter for screening mammogram for malignant neoplasm of breast: Secondary | ICD-10-CM

## 2023-07-25 ENCOUNTER — Ambulatory Visit

## 2023-07-27 ENCOUNTER — Ambulatory Visit
Admission: RE | Admit: 2023-07-27 | Discharge: 2023-07-27 | Disposition: A | Discharge status: Home / Self Care | Source: Ambulatory Visit | Attending: Internal Medicine | Admitting: Internal Medicine

## 2023-07-27 DIAGNOSIS — Z1231 Encounter for screening mammogram for malignant neoplasm of breast: Secondary | ICD-10-CM

## 2023-09-02 NOTE — Progress Notes (Signed)
 48 y.o. G15P3003 female here for annual exam. Married.  No LMP recorded.    She reports ***.  Abnormal bleeding: *** Pelvic discharge or pain: *** Breast mass, nipple discharge or skin changes : ***  Sexually active: *** Birth control: husband has vasectomy Last PAP:     Component Value Date/Time   DIAGPAP  09/18/2021 1538    - Negative for intraepithelial lesion or malignancy (NILM)   DIAGPAP  07/30/2020 1542    - Negative for intraepithelial lesion or malignancy (NILM)   ADEQPAP  09/18/2021 1538    Satisfactory for evaluation; transformation zone component PRESENT.   ADEQPAP  07/30/2020 1542    Satisfactory for evaluation; transformation zone component PRESENT.   Last mammogram: 07/27/23 *** Last colonoscopy: *** ***  Exercising: *** Smoker: ***  Flowsheet Row Office Visit from [facility]/10/2020 [facility] Total Score 0       GYN HISTORY: ***  OB History  Gravida Para Term Preterm AB Living  3 3 3   3   SAB IAB Ectopic Multiple Live Births          # Outcome Date GA Lbr Len/2nd Weight Sex Type Anes PTL Lv  3 Term           2 Term           1 Term            Past Medical History:  Diagnosis Date   Wears glasses    for distance when driving 13/10/6576   Past Surgical History:  Procedure Laterality Date   DILATATION & CURETTAGE/HYSTEROSCOPY WITH MYOSURE N/A 08/25/2022   Procedure: DILATATION & CURETTAGE/HYSTEROSCOPY WITH MYOSURE;  Surgeon: Lavoie, Marie-Lyne, MD;  Location: [facility];  Service: Gynecology;  Laterality: N/A;   NOSE SURGERY     Current Outpatient Medications on File Prior to Visit  Medication Sig Dispense Refill   cetirizine (ZYRTEC ALLERGY) 10 MG tablet Take 10 mg by mouth as needed.     No current facility-administered medications on file prior to visit.   Social History   Socioeconomic History   Marital status: Married    Spouse name: Not on file   Number of children: Not  on file   Years of education: Not on file   Highest education level: Not on file  Occupational History   Not on file  Tobacco Use   Smoking status: Never   Smokeless tobacco: Never  Vaping Use   Vaping status: Never Used  Substance and Sexual Activity   Alcohol use: No   Drug use: No   Sexual activity: Not Currently    Partners: Male    Birth control/protection: Other-see comments    Comment: 1st intercourse- 20's, Vasectomy  Other Topics Concern   Not on file  Social History Narrative   Not on file   Social Drivers of Health   Financial Resource Strain: Not on file  Food Insecurity: Not on file  Transportation Needs: Not on file  Physical Activity: Not on file  Stress: Not on file  Social Connections: Not on file  Intimate Partner Violence: Not on file   Family History  Problem Relation Age of Onset   Diabetes Father    Thyroid  disease Father    Hypertension Father    Thyroid  disease Daughter    Thyroid  disease Brother    Allergies Son    Breast cancer Neg Hx    Allergies  Allergen Reactions  Mening Acy&W-135 Diphth Conj     Other Reaction(s): local reaction     PE There were no vitals filed for this visit. There is no height or weight on file to calculate BMI.  Physical Exam    Assessment and Plan:        There are no diagnoses linked to this encounter. Maximino Spanner, CMA

## 2023-09-05 ENCOUNTER — Ambulatory Visit (INDEPENDENT_AMBULATORY_CARE_PROVIDER_SITE_OTHER): Admitting: Obstetrics and Gynecology

## 2023-09-05 ENCOUNTER — Encounter: Payer: Self-pay | Admitting: Obstetrics and Gynecology

## 2023-09-05 VITALS — BP 98/54 | HR 70 | Resp 14 | Ht 61.25 in | Wt 115.0 lb

## 2023-09-05 DIAGNOSIS — Z1331 Encounter for screening for depression: Secondary | ICD-10-CM

## 2023-09-05 DIAGNOSIS — N951 Menopausal and female climacteric states: Secondary | ICD-10-CM | POA: Insufficient documentation

## 2023-09-05 DIAGNOSIS — Z01419 Encounter for gynecological examination (general) (routine) without abnormal findings: Secondary | ICD-10-CM | POA: Insufficient documentation

## 2023-09-05 NOTE — Assessment & Plan Note (Signed)
 Anticipatory guidance provided. Lifestyle modifications for VMS were reviewed. She was counseled on proper nutrition, regular strength training. Use of supplements like black cohosh, Ashwaganda, Estroven. Recommend kegels, glute bridges. Considering applying aquaphor or coconut oil to the vulva after showers. You could also using daily vaginal moisturizers by brands like Good Clean Love and Ah! Yes. To follow-up for symptoms that are affecting her quality of life.

## 2023-09-05 NOTE — Assessment & Plan Note (Signed)
 Cervical cancer screening performed according to ASCCP guidelines. Encouraged annual mammogram screening Colonoscopy UTD DXA: due at 50yo Labs and immunizations with her primary Encouraged safe sexual practices as indicated Encouraged healthy lifestyle practices with diet and exercise For patients under 50-70yo, I recommend 1200mg  calcium daily and 600IU of vitamin D  daily.

## 2023-09-05 NOTE — Patient Instructions (Signed)

## 2023-09-05 NOTE — Progress Notes (Signed)
 48 y.o. G39P3003 female here for annual exam. Married, vasectomy. Stay at home mom. Children 20-25yo.  Patient's last menstrual period was 08/19/2023. Period Duration (Days): 90 Period Pattern: (!) Irregular Menstrual Flow: Light, Moderate Menstrual Control: Maxi pad Dysmenorrhea: None  She reports perimenopausal sx. +night sweats, emotional lability, mild urinary urge, longer time to arousal. No pain with intercourse.  Abnormal bleeding: irregular cycles, 2 this year, normal length Pelvic discharge or pain: None Breast mass, nipple discharge or skin changes : Occasional breast tenderness, however resolves.  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  Last PAP:     Component Value Date/Time   DIAGPAP  09/18/2021 1538    - Negative for intraepithelial lesion or malignancy (NILM)   DIAGPAP  07/30/2020 1542    - Negative for intraepithelial lesion or malignancy (NILM)   ADEQPAP  09/18/2021 1538    Satisfactory for evaluation; transformation zone component PRESENT.   ADEQPAP  07/30/2020 1542    Satisfactory for evaluation; transformation zone component PRESENT.   Last mammogram: 07/27/23 BI-RADS 1, density C Last colonoscopy: 12/16/22 q29yr   Exercising: Yes. Gym exercises although not as much recently  Smoker: no  Flowsheet Row Office Visit from 09/05/2023 in [facility]  PHQ-2 Total Score 0      GYN HISTORY: Post D&C/hysteroscopy with myosure on 08/25/22 for irregular bleeding. Patho Benign.    OB History  Gravida Para Term Preterm AB Living  3 3 3   3   SAB IAB Ectopic Multiple Live Births          # Outcome Date GA Lbr Len/2nd Weight Sex Type Anes PTL Lv  3 Term           2 Term           1 Term            Past Medical History:  Diagnosis Date   Wears glasses    for distance when driving 29/56/2130   Past Surgical History:  Procedure  Laterality Date   DILATATION & CURETTAGE/HYSTEROSCOPY WITH MYOSURE N/A 08/25/2022   Procedure: DILATATION & CURETTAGE/HYSTEROSCOPY WITH MYOSURE;  Surgeon: Lavoie, Marie-Lyne, MD;  Location: [facility];  Service: Gynecology;  Laterality: N/A;   NOSE SURGERY     No current outpatient medications on file prior to visit.   No current facility-administered medications on file prior to visit.   Social History   Socioeconomic History   Marital status: Married    Spouse name: Not on file   Number of children: Not on file   Years of education: Not on file   Highest education level: Not on file  Occupational History   Not on file  Tobacco Use   Smoking status: Never   Smokeless tobacco: Never  Vaping Use   Vaping status: Never Used  Substance and Sexual Activity   Alcohol use: No   Drug use: No   Sexual activity: Not Currently    Partners: Male    Birth control/protection: Other-see comments    Comment: 1st intercourse- 20's, Vasectomy  Other Topics Concern   Not on file  Social History Narrative   Not on file   Social Drivers of Health   Financial Resource Strain: Not on file  Food Insecurity: Not on file  Transportation Needs: Not on file  Physical Activity: Not on file  Stress: Not on  file  Social Connections: Not on file  Intimate Partner Violence: Not on file   Family History  Problem Relation Age of Onset   Diabetes Father    Thyroid  disease Father    Hypertension Father    Thyroid  disease Daughter    Thyroid  disease Brother    Allergies Son    Breast cancer Neg Hx    Allergies  Allergen Reactions   Mening Acy&W-135 Diphth Conj     Other Reaction(s): local reaction     PE Today's Vitals   09/05/23 1348  BP: (!) 98/54  Pulse: 70  Resp: 14  Weight: 115 lb (52.2 kg)  Height: 5' 1.25 (1.556 m)   Body mass index is 21.55 kg/m.  Physical Exam Vitals reviewed. Exam conducted with a chaperone present.  Constitutional:      General: She  is not in acute distress.    Appearance: Normal appearance.  HENT:     Head: Normocephalic and atraumatic.     Nose: Nose normal.   Eyes:     Extraocular Movements: Extraocular movements intact.     Conjunctiva/sclera: Conjunctivae normal.   Neck:     Thyroid : No thyroid  mass, thyromegaly or thyroid  tenderness.  Pulmonary:     Effort: Pulmonary effort is normal.  Chest:     Chest wall: No mass or tenderness.  Breasts:    Right: Normal. No swelling, mass, nipple discharge, skin change or tenderness.     Left: Normal. No swelling, mass, nipple discharge, skin change or tenderness.  Abdominal:     General: There is no distension.     Palpations: Abdomen is soft.     Tenderness: There is no abdominal tenderness.  Genitourinary:    General: Normal vulva.     Exam position: Lithotomy position.     Urethra: No prolapse.     Vagina: Normal. No vaginal discharge or bleeding.     Cervix: Normal. No lesion.     Uterus: Normal. Not enlarged and not tender.      Adnexa: Right adnexa normal and left adnexa normal.   Musculoskeletal:        General: Normal range of motion.     Cervical back: Normal range of motion.  Lymphadenopathy:     Upper Body:     Right upper body: No axillary adenopathy.     Left upper body: No axillary adenopathy.     Lower Body: No right inguinal adenopathy. No left inguinal adenopathy.   Skin:    General: Skin is warm and dry.   Neurological:     General: No focal deficit present.     Mental Status: She is alert.   Psychiatric:        Mood and Affect: Mood normal.        Behavior: Behavior normal.      Assessment and Plan:        Well woman exam with routine gynecological exam Assessment & Plan: Cervical cancer screening performed according to ASCCP guidelines. Encouraged annual mammogram screening Colonoscopy UTD DXA: due at 48yo Labs and immunizations with her primary Encouraged safe sexual practices as indicated Encouraged healthy lifestyle  practices with diet and exercise For patients under 50-70yo, I recommend 1200mg  calcium daily and 600IU of vitamin D  daily.    Negative depression screening  Perimenopause Assessment & Plan: Anticipatory guidance provided. Lifestyle modifications for VMS were reviewed. She was counseled on proper nutrition, regular strength training. Use of supplements like black cohosh, Ashwaganda, Estroven. Recommend kegels,  glute bridges. Considering applying aquaphor or coconut oil to the vulva after showers. You could also using daily vaginal moisturizers by brands like Good Clean Love and Ah! Yes. To follow-up for symptoms that are affecting her quality of life.    Romaine Closs, MD

## 2023-12-02 DIAGNOSIS — E7800 Pure hypercholesterolemia, unspecified: Secondary | ICD-10-CM | POA: Diagnosis not present

## 2023-12-02 DIAGNOSIS — E559 Vitamin D deficiency, unspecified: Secondary | ICD-10-CM | POA: Diagnosis not present

## 2023-12-02 DIAGNOSIS — R7303 Prediabetes: Secondary | ICD-10-CM | POA: Diagnosis not present

## 2023-12-02 DIAGNOSIS — Z0000 Encounter for general adult medical examination without abnormal findings: Secondary | ICD-10-CM | POA: Diagnosis not present

## 2023-12-06 DIAGNOSIS — Z0000 Encounter for general adult medical examination without abnormal findings: Secondary | ICD-10-CM | POA: Diagnosis not present

## 2023-12-06 DIAGNOSIS — R7303 Prediabetes: Secondary | ICD-10-CM | POA: Diagnosis not present

## 2023-12-06 DIAGNOSIS — E559 Vitamin D deficiency, unspecified: Secondary | ICD-10-CM | POA: Diagnosis not present

## 2023-12-06 DIAGNOSIS — E7800 Pure hypercholesterolemia, unspecified: Secondary | ICD-10-CM | POA: Diagnosis not present

## 2023-12-06 DIAGNOSIS — E049 Nontoxic goiter, unspecified: Secondary | ICD-10-CM | POA: Diagnosis not present

## 2023-12-27 NOTE — Progress Notes (Deleted)
 Office Visit Note  Patient: Laura Wells             Date of Birth: May 08, 1975           MRN: [id number]             PCP: Vernon Velna SAUNDERS, MD Referring: Vernon Velna SAUNDERS, MD Visit Date: 01/10/2024 Occupation: Data Unavailable  Subjective:  No chief complaint on file.   History of Present Illness: Laura Wells is a 48 y.o. female ***     Activities of Daily Living:  Patient reports morning stiffness for *** {minute/hour:19697}.   Patient {ACTIONS;DENIES/REPORTS:21021675::Denies} nocturnal pain.  Difficulty dressing/grooming: {ACTIONS;DENIES/REPORTS:21021675::Denies} Difficulty climbing stairs: {ACTIONS;DENIES/REPORTS:21021675::Denies} Difficulty getting out of chair: {ACTIONS;DENIES/REPORTS:21021675::Denies} Difficulty using hands for taps, buttons, cutlery, and/or writing: {ACTIONS;DENIES/REPORTS:21021675::Denies}  No Rheumatology ROS completed.   PMFS History:  Patient Active Problem List   Diagnosis Date Noted   Well woman exam with routine gynecological exam 09/05/2023   Perimenopause 09/05/2023   Vitamin D  deficiency 05/07/2020   Mixed hyperlipidemia 05/07/2020   Bacterial vaginosis 01/23/2019    Past Medical History:  Diagnosis Date   Wears glasses    for distance when driving 94/75/7975    Family History  Problem Relation Age of Onset   Diabetes Father    Thyroid  disease Father    Hypertension Father    Thyroid  disease Daughter    Thyroid  disease Brother    Allergies Son    Breast cancer Neg Hx    Past Surgical History:  Procedure Laterality Date   DILATATION & CURETTAGE/HYSTEROSCOPY WITH MYOSURE N/A 08/25/2022   Procedure: DILATATION & CURETTAGE/HYSTEROSCOPY WITH MYOSURE;  Surgeon: Lavoie, Marie-Lyne, MD;  Location: [facility];  Service: Gynecology;  Laterality: N/A;   NOSE SURGERY     Social History   Tobacco Use   Smoking status: Never   Smokeless tobacco: Never  Vaping Use   Vaping status: Never Used  Substance  Use Topics   Alcohol use: No   Drug use: No   Social History   Social History Narrative   Not on file     Immunization History  Administered Date(s) Administered   Fluad Trivalent(High Dose 65+) 02/18/2011   Influenza Nasal 12/29/2011   Influenza,inj,Quad PF,6+ Mos 04/10/2020   PFIZER(Purple Top)SARS-COV-2 Vaccination 06/07/2019, 06/28/2019   Tdap 01/09/2012, 09/17/2013     Objective: Vital Signs: There were no vitals taken for this visit.   Physical Exam   Musculoskeletal Exam: ***  CDAI Exam: CDAI Score: -- Patient Global: --; Provider Global: -- Swollen: --; Tender: -- Joint Exam 01/10/2024   No joint exam has been documented for this visit   There is currently no information documented on the homunculus. Go to the Rheumatology activity and complete the homunculus joint exam.  Investigation: No additional findings.  Imaging: No results found.  Recent Labs: Lab Results  Component Value Date   WBC 5.2 08/25/2022   HGB 11.2 (L) 08/25/2022   PLT 296 08/25/2022   NA 138 04/10/2020   K 4.0 04/10/2020   CL 103 04/10/2020   CO2 29 04/10/2020   GLUCOSE 97 04/10/2020   BUN 12 04/10/2020   CREATININE 0.80 04/10/2020   AST 23 04/10/2020   ALT 32 04/10/2020   CALCIUM 9.5 04/10/2020    Speciality Comments: No specialty comments available.  Procedures:  No procedures performed Allergies: Mening acy&w-135 diphth conj   Assessment / Plan:     Visit Diagnoses: No diagnosis found.  Orders: No orders of the defined  types were placed in this encounter.  No orders of the defined types were placed in this encounter.   Face-to-face time spent with patient was *** minutes. Greater than 50% of time was spent in counseling and coordination of care.  Follow-Up Instructions: No follow-ups on file.   Maya Nash, MD  Note - This record has been created using Animal nutritionist.  Chart creation errors have been sought, but may not always  have been located.  Such creation errors do not reflect on  the standard of medical care.

## 2024-01-10 ENCOUNTER — Encounter: Admitting: Rheumatology

## 2024-01-10 DIAGNOSIS — E782 Mixed hyperlipidemia: Secondary | ICD-10-CM

## 2024-01-10 DIAGNOSIS — M2550 Pain in unspecified joint: Secondary | ICD-10-CM

## 2024-01-10 DIAGNOSIS — E559 Vitamin D deficiency, unspecified: Secondary | ICD-10-CM

## 2024-01-12 ENCOUNTER — Other Ambulatory Visit (HOSPITAL_BASED_OUTPATIENT_CLINIC_OR_DEPARTMENT_OTHER): Payer: Self-pay | Admitting: Internal Medicine

## 2024-01-12 DIAGNOSIS — E7800 Pure hypercholesterolemia, unspecified: Secondary | ICD-10-CM

## 2024-01-12 DIAGNOSIS — Z8249 Family history of ischemic heart disease and other diseases of the circulatory system: Secondary | ICD-10-CM

## 2024-01-27 ENCOUNTER — Ambulatory Visit (HOSPITAL_BASED_OUTPATIENT_CLINIC_OR_DEPARTMENT_OTHER)
Admission: RE | Admit: 2024-01-27 | Discharge: 2024-01-27 | Disposition: A | Discharge status: Home / Self Care | Payer: Self-pay | Source: Ambulatory Visit | Attending: Internal Medicine | Admitting: Internal Medicine

## 2024-01-27 DIAGNOSIS — E7800 Pure hypercholesterolemia, unspecified: Secondary | ICD-10-CM

## 2024-01-27 DIAGNOSIS — Z8249 Family history of ischemic heart disease and other diseases of the circulatory system: Secondary | ICD-10-CM

## 2024-09-05 ENCOUNTER — Ambulatory Visit: Admitting: Obstetrics and Gynecology
